# Patient Record
Sex: Female | Born: 1990 | Race: White | Hispanic: No | Marital: Single | Smoking: Current every day smoker
Health system: Southern US, Community
[De-identification: ages and names within clinical notes are randomized; demographics above are authoritative.]

## PROBLEM LIST (undated history)

## (undated) DIAGNOSIS — T7840XA Allergy, unspecified, initial encounter: Secondary | ICD-10-CM

## (undated) DIAGNOSIS — M21611 Bunion of right foot: Secondary | ICD-10-CM

## (undated) DIAGNOSIS — K219 Gastro-esophageal reflux disease without esophagitis: Secondary | ICD-10-CM

## (undated) DIAGNOSIS — M1990 Unspecified osteoarthritis, unspecified site: Secondary | ICD-10-CM

## (undated) DIAGNOSIS — D649 Anemia, unspecified: Secondary | ICD-10-CM

## (undated) HISTORY — PX: WISDOM TOOTH EXTRACTION: SHX21

## (undated) HISTORY — DX: Allergy, unspecified, initial encounter: T78.40XA

## (undated) HISTORY — DX: Anemia, unspecified: D64.9

## (undated) HISTORY — PX: FOOT SURGERY: SHX648

---

## 2009-08-29 ENCOUNTER — Encounter
Admission: RE | Admit: 2009-08-29 | Discharge: 2009-11-27 | Payer: Self-pay | Source: Home / Self Care | Admitting: *Deleted

## 2012-06-21 ENCOUNTER — Inpatient Hospital Stay (HOSPITAL_COMMUNITY): Payer: Medicaid Other

## 2012-06-21 ENCOUNTER — Inpatient Hospital Stay (HOSPITAL_COMMUNITY)
Admission: AD | Admit: 2012-06-21 | Discharge: 2012-06-21 | Disposition: A | Discharge status: Home / Self Care | Payer: Medicaid Other | Source: Ambulatory Visit | Attending: Obstetrics and Gynecology | Admitting: Obstetrics and Gynecology

## 2012-06-21 ENCOUNTER — Encounter (HOSPITAL_COMMUNITY): Payer: Self-pay | Admitting: Obstetrics and Gynecology

## 2012-06-21 DIAGNOSIS — N949 Unspecified condition associated with female genital organs and menstrual cycle: Secondary | ICD-10-CM

## 2012-06-21 DIAGNOSIS — N760 Acute vaginitis: Secondary | ICD-10-CM

## 2012-06-21 DIAGNOSIS — T8339XA Other mechanical complication of intrauterine contraceptive device, initial encounter: Secondary | ICD-10-CM | POA: Insufficient documentation

## 2012-06-21 DIAGNOSIS — B9689 Other specified bacterial agents as the cause of diseases classified elsewhere: Secondary | ICD-10-CM | POA: Insufficient documentation

## 2012-06-21 DIAGNOSIS — T8332XA Displacement of intrauterine contraceptive device, initial encounter: Secondary | ICD-10-CM

## 2012-06-21 DIAGNOSIS — A499 Bacterial infection, unspecified: Secondary | ICD-10-CM | POA: Insufficient documentation

## 2012-06-21 LAB — CBC
Hemoglobin: 13.5 g/dL (ref 12.0–15.0)
MCH: 33 pg (ref 26.0–34.0)
MCV: 96.1 fL (ref 78.0–100.0)
Platelets: 220 10*3/uL (ref 150–400)
RBC: 4.09 MIL/uL (ref 3.87–5.11)

## 2012-06-21 LAB — WET PREP, GENITAL: Trich, Wet Prep: NONE SEEN

## 2012-06-21 LAB — URINALYSIS, ROUTINE W REFLEX MICROSCOPIC
Bilirubin Urine: NEGATIVE
Ketones, ur: NEGATIVE mg/dL
Nitrite: NEGATIVE
pH: 6.5 (ref 5.0–8.0)

## 2012-06-21 MED ORDER — LIDOCAINE HCL (PF) 1 % IJ SOLN
INTRAMUSCULAR | Status: AC
  Administered 2012-06-21: 30 mL
  Filled 2012-06-21: qty 30

## 2012-06-21 MED ORDER — NORGESTIMATE-ETH ESTRADIOL 0.25-35 MG-MCG PO TABS: 1.0000 | ORAL_TABLET | Freq: Every day | ORAL | Status: DC

## 2012-06-21 MED ORDER — METRONIDAZOLE 500 MG PO TABS: 500.0000 mg | ORAL_TABLET | Freq: Two times a day (BID) | ORAL | Status: DC

## 2012-06-21 MED ORDER — OXYCODONE-ACETAMINOPHEN 5-325 MG PO TABS
2.0000 | ORAL_TABLET | Freq: Once | ORAL | Status: AC
  Administered 2012-06-21: 2 via ORAL
  Filled 2012-06-21: qty 2

## 2012-06-21 MED ORDER — IBUPROFEN 100 MG PO TABS: 800.0000 mg | ORAL_TABLET | Freq: Three times a day (TID) | ORAL | Status: DC | PRN

## 2012-06-21 MED ORDER — KETOROLAC TROMETHAMINE 60 MG/2ML IM SOLN
60.0000 mg | Freq: Once | INTRAMUSCULAR | Status: AC
  Administered 2012-06-21: 60 mg via INTRAMUSCULAR
  Filled 2012-06-21: qty 2

## 2012-06-21 NOTE — MAU Note (Signed)
Pt reports she has had merena in for 3 1/2 years. Starting to have abd pain and cramping and increased vaginal bleeding with a large Clot on Thursday night. Went to [facility] [place] yesterday and was told they did not know what was wrong with her so she deided to come here.

## 2012-06-21 NOTE — MAU Note (Signed)
Monique [name] is here in MAU with requests to have her IUD removed. She has had pelvic pain for months following a pelvic infection that was "caused by the IUD".

## 2012-06-21 NOTE — MAU Provider Note (Signed)
History     CSN: [id number]  Arrival date and time: 06/21/12 1144   None     Chief Complaint  Patient presents with  . Pelvic Pain   HPI 22 y.o. G1P0001 with pelvic pain since January of last year when she was treated for "an infected cervix", started having bleeding on Friday, pain is worsening. Went to [place] last night had bimanual exam and had antibiotic shot, did not have any test for infections or ultrasound.    History reviewed. No pertinent past medical history.  History reviewed. No pertinent past surgical history.  No family history on file.  History  Substance Use Topics  . Smoking status: Current Every Day Smoker  . Smokeless tobacco: Not on file  . Alcohol Use: Yes    Allergies: Allergies not on file  No prescriptions prior to admission    Review of Systems  Constitutional: Negative.   Respiratory: Negative.   Cardiovascular: Negative.   Gastrointestinal: Positive for abdominal pain. Negative for nausea, vomiting, diarrhea and constipation.  Genitourinary: Negative for dysuria, urgency, frequency, hematuria and flank pain.       Positive vaginal bleeding   Musculoskeletal: Negative.   Neurological: Negative.   Psychiatric/Behavioral: Negative.    Physical Exam   Blood pressure 113/69, pulse 80, temperature 97.7 F (36.5 C), temperature source Oral, resp. rate 18, height 5' 3.75" (1.619 m), weight 160 lb 9.6 oz (72.848 kg).  Physical Exam  Nursing note and vitals reviewed. Constitutional: She is oriented to person, place, and time. She appears well-developed and well-nourished. No distress.  Cardiovascular: Normal rate.   Respiratory: Effort normal.  GI: Soft. There is no tenderness.  Genitourinary: There is no tenderness or lesion on the right labia. There is no tenderness or lesion on the left labia. Uterus is tender. Cervix exhibits motion tenderness. Cervix exhibits no discharge and no friability. Right adnexum displays tenderness. Right  adnexum displays no mass and no fullness. Left adnexum displays tenderness. Left adnexum displays no mass and no fullness. There is bleeding around the vagina. No erythema around the vagina. Vaginal discharge found.  Attempted to remove IUD per patient request, grasped strings with ring forceps, unable to remove, pt reports severe pain with traction on strings  Musculoskeletal: Normal range of motion.  Neurological: She is alert and oriented to person, place, and time.  Skin: Skin is warm and dry.  Psychiatric: She has a normal mood and affect.    MAU Course  Procedures Results for orders placed during the hospital encounter of 06/21/12 (from the past 24 hour(s))  URINALYSIS, ROUTINE W REFLEX MICROSCOPIC     Status: None   Collection Time    06/21/12 12:09 PM      Result Value Range   Color, Urine YELLOW  YELLOW   APPearance CLEAR  CLEAR   Specific Gravity, Urine 1.010  1.005 - 1.030   pH 6.5  5.0 - 8.0   Glucose, UA NEGATIVE  NEGATIVE mg/dL   Hgb urine dipstick NEGATIVE  NEGATIVE   Bilirubin Urine NEGATIVE  NEGATIVE   Ketones, ur NEGATIVE  NEGATIVE mg/dL   Protein, ur NEGATIVE  NEGATIVE mg/dL   Urobilinogen, UA 0.2  0.0 - 1.0 mg/dL   Nitrite NEGATIVE  NEGATIVE   Leukocytes, UA NEGATIVE  NEGATIVE  POCT PREGNANCY, URINE     Status: None   Collection Time    06/21/12 12:15 PM      Result Value Range   Preg Test, Ur NEGATIVE  NEGATIVE  WET PREP, GENITAL     Status: Abnormal   Collection Time    06/21/12  1:00 PM      Result Value Range   Yeast Wet Prep HPF POC NONE SEEN  NONE SEEN   Trich, Wet Prep NONE SEEN  NONE SEEN   Clue Cells Wet Prep HPF POC MANY (*) NONE SEEN   WBC, Wet Prep HPF POC FEW (*) NONE SEEN  CBC     Status: None   Collection Time    06/21/12  1:20 PM      Result Value Range   WBC 8.7  4.0 - 10.5 K/uL   RBC 4.09  3.87 - 5.11 MIL/uL   Hemoglobin 13.5  12.0 - 15.0 g/dL   HCT 44.0  34.7 - 42.5 %   MCV 96.1  78.0 - 100.0 fL   MCH 33.0  26.0 - 34.0 pg   MCHC  34.4  30.0 - 36.0 g/dL   RDW 95.6  38.7 - 56.4 %   Platelets 220  150 - 400 K/uL   US Transvaginal Non-ob  06/21/2012  *RADIOLOGY REPORT*  Clinical Data: Pelvic pain.  IUD.  TRANSABDOMINAL AND TRANSVAGINAL ULTRASOUND OF PELVIS Technique:  Both transabdominal and transvaginal ultrasound examinations of the pelvis were performed. Transabdominal technique was performed for global imaging of the pelvis including uterus, ovaries, adnexal regions, and pelvic cul-de-sac.  It was necessary to proceed with endovaginal exam following the transabdominal exam to visualize the the endometrium.  Comparison:  04/10/2011  Findings:  Uterus: 3.3 x 4.2 x 7.3 cm, unremarkable.  Endometrium: IUD projects in expected location.  Endometrium 4.1 mm in thickness.  Right ovary:  Unremarkable, 17 x 19 x 32 mm.  No adnexal mass.  Left ovary: 18 x 26 x 30 mm, unremarkable.  No adnexal mass.  Other findings: No free fluid  IMPRESSION:  1.  IUD projects in expected location. 2.  No mass or other abnormalities identified.   Original Report Authenticated By: D. Andria Rhein, MD    US Pelvis Complete  06/21/2012  *RADIOLOGY REPORT*  Clinical Data: Pelvic pain.  IUD.  TRANSABDOMINAL AND TRANSVAGINAL ULTRASOUND OF PELVIS Technique:  Both transabdominal and transvaginal ultrasound examinations of the pelvis were performed. Transabdominal technique was performed for global imaging of the pelvis including uterus, ovaries, adnexal regions, and pelvic cul-de-sac.  It was necessary to proceed with endovaginal exam following the transabdominal exam to visualize the the endometrium.  Comparison:  04/10/2011  Findings:  Uterus: 3.3 x 4.2 x 7.3 cm, unremarkable.  Endometrium: IUD projects in expected location.  Endometrium 4.1 mm in thickness.  Right ovary:  Unremarkable, 17 x 19 x 32 mm.  No adnexal mass.  Left ovary: 18 x 26 x 30 mm, unremarkable.  No adnexal mass.  Other findings: No free fluid  IMPRESSION:  1.  IUD projects in expected location. 2.   No mass or other abnormalities identified.   Original Report Authenticated By: D. Andria Rhein, MD     Dr. Emelda Fear to MAU - with pt written consent, paracervical block placed and IUD removed without difficulty   Assessment and Plan   1. Malpositioned IUD, initial encounter   2. BV (bacterial vaginosis)   Pt requested OCPs for ongoing contraception - gave rx x 2 months, needs to f/u with her own doctor. Use back up method for 7 days.     Medication List    TAKE these medications       ibuprofen 100 MG  tablet  Commonly known as:  ADVIL,MOTRIN  Take 8 tablets (800 mg total) by mouth every 8 (eight) hours as needed for pain or fever.     metroNIDAZOLE 500 MG tablet  Commonly known as:  FLAGYL  Take 1 tablet (500 mg total) by mouth 2 (two) times daily.     norgestimate-ethinyl estradiol 0.25-35 MG-MCG tablet  Commonly known as:  ORTHO-CYCLEN,SPRINTEC,PREVIFEM  Take 1 tablet by mouth daily.            Follow-up Information   Schedule an appointment as soon as possible for a visit with your doctor.       Hether Anselmo 06/21/2012, 12:25 PM

## 2012-06-21 NOTE — MAU Provider Note (Signed)
Patient interviewed, REVIEWED IDENTIFYING THE IUD AS BEING IN THE LOWER UTERINE SEGMENT WITH THE ARMS OF THE IUD SUGGESTING INTRAMUSCULAR POSITION. Consent obtained and then IUD removed under paracervical block after timeout conducted and consents obtained  Remainder of evaluation as per documentation by Georges Mouse CNM

## 2014-01-31 ENCOUNTER — Encounter (HOSPITAL_COMMUNITY): Payer: Self-pay | Admitting: Obstetrics and Gynecology

## 2014-06-27 ENCOUNTER — Encounter (HOSPITAL_COMMUNITY): Payer: Self-pay | Admitting: Emergency Medicine

## 2014-06-27 ENCOUNTER — Emergency Department (HOSPITAL_COMMUNITY)
Admission: EM | Admit: 2014-06-27 | Discharge: 2014-06-27 | Disposition: A | Discharge status: Home / Self Care | Payer: Medicaid Other | Attending: Emergency Medicine | Admitting: Emergency Medicine

## 2014-06-27 ENCOUNTER — Emergency Department (HOSPITAL_COMMUNITY): Payer: Medicaid Other

## 2014-06-27 DIAGNOSIS — Y998 Other external cause status: Secondary | ICD-10-CM | POA: Insufficient documentation

## 2014-06-27 DIAGNOSIS — Z720 Tobacco use: Secondary | ICD-10-CM | POA: Insufficient documentation

## 2014-06-27 DIAGNOSIS — Z79899 Other long term (current) drug therapy: Secondary | ICD-10-CM | POA: Insufficient documentation

## 2014-06-27 DIAGNOSIS — X58XXXA Exposure to other specified factors, initial encounter: Secondary | ICD-10-CM | POA: Insufficient documentation

## 2014-06-27 DIAGNOSIS — Z792 Long term (current) use of antibiotics: Secondary | ICD-10-CM | POA: Insufficient documentation

## 2014-06-27 DIAGNOSIS — M795 Residual foreign body in soft tissue: Secondary | ICD-10-CM

## 2014-06-27 DIAGNOSIS — Z791 Long term (current) use of non-steroidal anti-inflammatories (NSAID): Secondary | ICD-10-CM | POA: Insufficient documentation

## 2014-06-27 DIAGNOSIS — Y9289 Other specified places as the place of occurrence of the external cause: Secondary | ICD-10-CM | POA: Insufficient documentation

## 2014-06-27 DIAGNOSIS — B070 Plantar wart: Secondary | ICD-10-CM | POA: Insufficient documentation

## 2014-06-27 DIAGNOSIS — Y9389 Activity, other specified: Secondary | ICD-10-CM | POA: Insufficient documentation

## 2014-06-27 DIAGNOSIS — S90851A Superficial foreign body, right foot, initial encounter: Secondary | ICD-10-CM | POA: Insufficient documentation

## 2014-06-27 MED ORDER — CEPHALEXIN 500 MG PO CAPS: 500.0000 mg | ORAL_CAPSULE | Freq: Four times a day (QID) | ORAL | Status: DC

## 2014-06-27 MED ORDER — IBUPROFEN 800 MG PO TABS: 800.0000 mg | ORAL_TABLET | Freq: Three times a day (TID) | ORAL | Status: DC

## 2014-06-27 NOTE — Discharge Instructions (Signed)
Plantar Warts  Plantar warts are growths on the bottom of your foot. Warts are caused by a germ.   HOME CARE  · Soak your foot in warm water. Dry your foot when you are done. Remove the top layer of softened skin, then apply any medicine as told by your doctor.  · Remove any bandages daily. File off extra wart tissue. Repeat this as told by your doctor until the wart goes away.  · Only use medicine as told by your doctor.  · Use a bandage with a hole in it (doughnut bandage) to relieve pain. Put the hole over the wart.  · Wear shoes and socks and change them daily.  · Keep your foot clean and dry.  · Check your feet regularly.  · Avoid contact with warts on other people.  · Have your warts checked by your doctor.  GET HELP RIGHT AWAY IF:  The treated skin becomes red, puffy (swollen), or painful.  MAKE SURE YOU:  · Understand these instructions.  · Will watch your condition.  · Will get help right away if you are not doing well or get worse.  Document Released: 04/20/2010 Document Revised: 08/02/2013 Document Reviewed: 04/20/2010  Exit[employer]® Patient Information ©2015 [employer]. This information is not intended to replace advice given to you by your health care provider. Make sure you discuss any questions you have with your health care provider.

## 2014-06-27 NOTE — ED Notes (Signed)
For last 6-8 months there has been spot on bottom of right right foot.  In last 2 weeks, notice swelling and area opened up last night.  Rates pain 10/10.  Took tylenol with no relief.

## 2014-06-27 NOTE — ED Provider Notes (Signed)
CSN: [id number]     Arrival date & time 06/27/14  1443 History  This chart was scribed for non-physician practitioner, Kem Parkinson, PA-C, working with Orlie Dakin, MD, by Chester Holstein, ED Scribe. This patient was seen in room APFT20/APFT20 and the patient's care was started at [facility] PM.    Chief Complaint  Patient presents with  . Foot Pain    Patient is a 24 y.o. female presenting with lower extremity pain. The history is provided by the patient. No language interpreter was used.  Foot Pain   HPI Comments: Monique [name] is a 24 y.o. female who presents to the Emergency Department complaining of wound to plantar surface of right foot for 6-8 months, and worsening in last 2 weeks. Pt notes associated pain, redness, warmth, and swelling, stating it is difficult to wear shoes. Pt states she is unable to ambulate today due to pain.  Pt has tried to clean area with peroxide and states solution reacted to area with bubbling. She notes she often goes barefooted. Pt has been taking Tylenol with no relief. Pt has tried Epsom soak and states increased pain with use. Pt denies any known injury, fever, and chills ,possible foreign body or symptoms proximal to the foot.    History reviewed. No pertinent past medical history. History reviewed. No pertinent past surgical history. History reviewed. No pertinent family history. History  Substance Use Topics  . Smoking status: Current Every Day Smoker  . Smokeless tobacco: Not on file  . Alcohol Use: Yes   OB History    Gravida Para Term Preterm AB TAB SAB Ectopic Multiple Living   1 0 0 0 0 0 0 0 0 1      Review of Systems  Constitutional: Negative for fever, chills and activity change.  Musculoskeletal: Positive for myalgias (right foot pain) and joint swelling.  Skin: Positive for wound.  Neurological: Negative for weakness, light-headedness and numbness.  Hematological: Does not bruise/bleed easily.  All other systems reviewed and are  negative.     Allergies  Review of patient's allergies indicates no known allergies.  Home Medications   Prior to Admission medications   Medication Sig Start Date End Date Taking? Authorizing Provider  ibuprofen (ADVIL,MOTRIN) 100 MG tablet Take 8 tablets (800 mg total) by mouth every 8 (eight) hours as needed for pain or fever. 06/21/12   Rosezella Rumpf, CNM  metroNIDAZOLE (FLAGYL) 500 MG tablet Take 1 tablet (500 mg total) by mouth 2 (two) times daily. 06/21/12   Rosezella Rumpf, CNM  norgestimate-ethinyl estradiol (ORTHO-CYCLEN,SPRINTEC,PREVIFEM) 0.25-35 MG-MCG tablet Take 1 tablet by mouth daily. 06/21/12   Rosezella Rumpf, CNM   BP 127/83 mmHg  Pulse 82  Temp(Src) 98.2 F (36.8 C) (Oral)  Resp 18  Ht 5\' 4"  (1.626 m)  Wt 169 lb (76.658 kg)  BMI 28.99 kg/m2  SpO2 100%  LMP 05/25/2014 Physical Exam  Constitutional: She is oriented to person, place, and time. She appears well-developed and well-nourished. No distress.  HENT:  Head: Normocephalic and atraumatic.  Eyes: Conjunctivae are normal.  Neck: Normal range of motion. Neck supple.  Cardiovascular: Normal rate, regular rhythm, normal heart sounds and intact distal pulses.   No murmur heard. Pulmonary/Chest: Effort normal and breath sounds normal. No respiratory distress.  Musculoskeletal: Normal range of motion. She exhibits tenderness. She exhibits no edema.  Large plantar wart lateral right foot; no erythema or edema.  No open lesions or obvious FB's.  DP pulses and distal intact intact.  Neurological: She is alert and oriented to person, place, and time. Coordination normal.  Skin: Skin is warm and dry. No erythema.  Psychiatric: She has a normal mood and affect. Her behavior is normal.  Nursing note and vitals reviewed.   ED Course  Procedures (including critical care time) DIAGNOSTIC STUDIES: Oxygen Saturation is 100% on room air, normal by my interpretation.    COORDINATION OF CARE: 3:32 PM Discussed  treatment plan with patient at beside, the patient agrees with the plan and has no further questions at this time.   Labs Review Labs Reviewed - No data to display  Imaging Review Dg Foot Complete Right  06/27/2014   CLINICAL DATA:  Soft tissue wound  EXAM: RIGHT FOOT COMPLETE - 3+ VIEW  COMPARISON:  None.  FINDINGS: There is no evidence of fracture or dislocation. There is no evidence of arthropathy or other focal bone abnormality. No radiopaque foreign body is identified.  IMPRESSION: No acute abnormality noted.   Electronically Signed   By: Inez Catalina M.D.   On: 06/27/2014 16:02     EKG Interpretation None      MDM   Final diagnoses:  Foreign body (FB) in soft tissue  Plantar wart of right foot   Pt is well appearing.  Ambulates with steady gait.  She has a large, chronic appearing plantar wart without signs of acute infection, although she notes swelling of the foot and redness several days ago. No radiographic evidence of soft tissue FB.  (FB not a diagnosis, used as indication for imaging and pulled into chart automatically)  Given referral info for podiatry and pt agrees to plan.     I personally performed the services described in this documentation, which was scribed in my presence. The recorded information has been reviewed and is accurate.'    Patrice Paradise, PA-C 06/28/14 2124  Orlie Dakin, MD 06/29/14 1423

## 2014-06-27 NOTE — ED Notes (Signed)
Rt foot pain, plantar surface.  Foot is very dirty.

## 2016-01-09 ENCOUNTER — Ambulatory Visit (INDEPENDENT_AMBULATORY_CARE_PROVIDER_SITE_OTHER): Payer: 59

## 2016-01-09 ENCOUNTER — Encounter: Payer: Self-pay | Admitting: Podiatry

## 2016-01-09 ENCOUNTER — Ambulatory Visit (INDEPENDENT_AMBULATORY_CARE_PROVIDER_SITE_OTHER): Payer: 59 | Admitting: Podiatry

## 2016-01-09 VITALS — BP 126/80 | HR 55 | Resp 14 | Ht 64.0 in | Wt 140.0 lb

## 2016-01-09 DIAGNOSIS — M778 Other enthesopathies, not elsewhere classified: Secondary | ICD-10-CM

## 2016-01-09 DIAGNOSIS — M7751 Other enthesopathy of right foot: Secondary | ICD-10-CM | POA: Diagnosis not present

## 2016-01-09 DIAGNOSIS — M2010 Hallux valgus (acquired), unspecified foot: Secondary | ICD-10-CM

## 2016-01-09 DIAGNOSIS — M779 Enthesopathy, unspecified: Secondary | ICD-10-CM

## 2016-01-09 DIAGNOSIS — R52 Pain, unspecified: Secondary | ICD-10-CM

## 2016-01-09 NOTE — Progress Notes (Signed)
   Subjective:    Patient ID: Monique [name], female    DOB: 1990-08-04, 25 y.o.   MRN: [id number]  HPI this patient presents to the office with chief complaint of a painful callus for over a year. She says that the callus is especially painful and sore due to her job which requires significant amount of standing and walking. She says this callus is painful walking and wearing her shoes. She says she was seen by a podiatrist 3-4 years ago and diagnosed with tendinitis. according to the patient. She also says the callus was surgically removed but has had quickly returned.  She says that when she walks. She turns her foot outward to avoid pain in her big toe joint. She has no history of trauma or injury to the foot. No other self treatment was provided. She presents the office today for an evaluation and treatment of this condition    Review of Systems  All other systems reviewed and are negative.      Objective:   Physical Exam GENERAL APPEARANCE: Alert, conversant. Appropriately groomed. No acute distress.  VASCULAR: Pedal pulses are  palpable at  [facility] and PT bilateral.  Capillary refill time is immediate to all digits,  Normal temperature gradient.  Digital hair growth is present bilateral  NEUROLOGIC: sensation is normal to 5.07 monofilament at 5/5 sites bilateral.  Light touch is intact bilateral, Muscle strength normal.  MUSCULOSKELETAL: acceptable muscle strength, tone and stability bilateral.  Intrinsic muscluature intact bilateral. 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  Pain on palpation dorsal aspect first metatarsal.  Palpable pain sub 4th metatarsal right foot.  DERMATOLOGIC: skin color, texture, and turgor are within normal limits.  No preulcerative lesions or ulcers  are seen, no interdigital maceration noted.  No open lesions present.  Digital nails are asymptomatic. No drainage noted. Severe porokeratotic lesion sub 4th metatarsal right foot.         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   IE  Xray reveals severe HAV.  I believe that due to her job position. She needs to perform significant amount of walking. This causes the pro-keratoses on her right forefoot to be painful. This porokeratosis is caused due to the severity of the bunion, right foot. I offered to provide injection therapy for her capsulitis which she was not interested in receiving. I then told her she would benefit from a surgical consult with one of the surgeons in the office. She is to make an appointment with Dr. Paulla Dolly as she leaves today   Gardiner Barefoot DPM

## 2016-01-11 ENCOUNTER — Telehealth: Payer: Self-pay | Admitting: *Deleted

## 2016-01-11 NOTE — Telephone Encounter (Signed)
OK to do ibuprofen. I am not going to prescribe a pain medication prior to surgery on a patient I have not even seen yet.

## 2016-01-11 NOTE — Telephone Encounter (Addendum)
Pt states at appt 01/09/2016 Dr. Prudence Davidson referred to Dr. Jacqualyn Posey for surgery 01/19/2016. Pt states her foot hurts and she wanted to know if Dr. Jacqualyn Posey would prescribe Ibuprofen until the surgery. 01/12/2016-Unable to leave message pt's voice mailbox is not set up. 01/19/2016-Pt states Dr. Jacqualyn Posey forgot to call pain medication to the [facility]. Informed pt the rx would be faxed to [facility]. Faxed tramadol rx.

## 2016-01-19 ENCOUNTER — Ambulatory Visit (INDEPENDENT_AMBULATORY_CARE_PROVIDER_SITE_OTHER): Payer: 59 | Admitting: Podiatry

## 2016-01-19 ENCOUNTER — Encounter: Payer: Self-pay | Admitting: Podiatry

## 2016-01-19 DIAGNOSIS — M778 Other enthesopathies, not elsewhere classified: Secondary | ICD-10-CM

## 2016-01-19 DIAGNOSIS — M779 Enthesopathy, unspecified: Secondary | ICD-10-CM

## 2016-01-19 DIAGNOSIS — M2010 Hallux valgus (acquired), unspecified foot: Secondary | ICD-10-CM | POA: Diagnosis not present

## 2016-01-19 DIAGNOSIS — Q828 Other specified congenital malformations of skin: Secondary | ICD-10-CM

## 2016-01-19 DIAGNOSIS — M7751 Other enthesopathy of right foot: Secondary | ICD-10-CM

## 2016-01-19 MED ORDER — TRAMADOL HCL 50 MG PO TABS: 50.0000 mg | ORAL_TABLET | Freq: Two times a day (BID) | ORAL | 0 refills | Status: DC | PRN

## 2016-02-12 NOTE — Progress Notes (Signed)
Subjective: 25 year old female presents the office today for surgical consultation, evaluation of painful callus on the right foot as well as for bilateral bunion deformities. She states that she had a callus previously surgically removed but it quickly we turned. She does a lot of walking and standing all day. She states that she has difficulty wearing certain shoes. She was seen by another physician about 4 years ago and she was treated for tendinitis. She said no recent treatment for this otherwise. She was last seen by Dr. Prudence Davidson and she declined a steroid injection. Denies any systemic complaints such as fevers, chills, nausea, vomiting. No acute changes since last appointment, and no other complaints at this time.   Objective: AAO x3, NAD DP/PT pulses palpable bilaterally, CRT less than 3 seconds Mild HAV is present on the right foot. There is minimal tenderness palpation of the bunion. The majority of her tenderness appears below the lesser callus submetatarsal 4. She says been ongoing for quite some time. Upon debridement there is no ongoing ulceration, drainage or any signs of infection. There is no pinpoint bleeding or evidence of verruca there is no foreign body . There is localized edema to the area any erythema or increase in warmth. There is no drainage or pus expressed.  No open lesions or pre-ulcerative lesions.  No pain with calf compression, swelling, warmth, erythema  Assessment: Hyperkerotic lesion to the left 4th submet  Plan: -All treatment options discussed with the patient including all alternatives, risks, complications.  -previous x-rays were reviewed. I discussed with conservative and surgical treatment options. At ths point majority of her symptoms are localized to the callus d she is having minimal tenderness in the bunion. I do not believe that while fixing the bunion is going to fix her problems submetatarsal 4. And she wishes to continue with conservative treatment for  now and she does 2. Hyperkeratotic lesion was debrided without, occasions or bleeding. Area was cleaned and salicylic acid applied followed by a bandage. Post procedure tractions were discussed. I'll see her back in 4 weeks to see how she is doing or sooner if any issues are to arise. -Patient encouraged to call the office with any questions, concerns, change in symptoms.   Celesta Gentile, DPM

## 2016-02-16 ENCOUNTER — Encounter: Payer: Self-pay | Admitting: Podiatry

## 2016-02-16 ENCOUNTER — Ambulatory Visit (INDEPENDENT_AMBULATORY_CARE_PROVIDER_SITE_OTHER): Payer: 59 | Admitting: Podiatry

## 2016-02-16 DIAGNOSIS — Q828 Other specified congenital malformations of skin: Secondary | ICD-10-CM

## 2016-02-16 MED ORDER — HYDROCODONE-ACETAMINOPHEN 5-325 MG PO TABS: 1.0000 | ORAL_TABLET | Freq: Four times a day (QID) | ORAL | 0 refills | Status: DC | PRN

## 2016-02-26 NOTE — Progress Notes (Signed)
Subjective: 25 year old female presents the office they for follow-up evaluation of callus to the right foot to metatarsal 4. She states that the area after the acid treatment the area blisters some and a bigger but it looks significant peel off. Area is tender with pressure in shoe gear. She denies any swelling or redness or any drainage. Denies any systemic complaints such as fevers, chills, nausea, vomiting. No acute changes since last appointment, and no other complaints at this time.   Objective: AAO x3, NAD DP/PT pulses palpable bilaterally, CRT less than 3 seconds Right first metatarsal 4 hyperkeratotic lesion. There is thick hyperkeratotic tissue which is peeling off likely from the acid treatment. Upon debridement there does appear to be a core to this area which is starting to come out more. There is tenderness sharply along this lesion. There is no identifiable  No  foreign body in there is no evidence of verruca today.edema, erythema, increase in warmth to bilateral lower extremities.  No open lesions or pre-ulcerative lesions.  No pain with calf compression, swelling, warmth, erythema  Assessment: Porokeratosis right foot   Plan: -All treatment options discussed with the patient including all alternatives, risks, complications.  -Lesion sharply debrided without complications or bleeding. The areas clean and pelvis place followed by salicylic acid and a bandage. It appears that the deep portion of the calluses starting to come out more. Monitor for infection. Post-procedure instructions were discussed.  -Patient encouraged to call the office with any questions, concerns, change in symptoms.   Celesta Gentile, DPM

## 2016-03-08 ENCOUNTER — Ambulatory Visit: Payer: 59 | Admitting: Podiatry

## 2016-03-14 ENCOUNTER — Ambulatory Visit: Payer: 59 | Admitting: Podiatry

## 2016-05-05 ENCOUNTER — Emergency Department (HOSPITAL_COMMUNITY)
Admission: EM | Admit: 2016-05-05 | Discharge: 2016-05-06 | Disposition: A | Discharge status: Home / Self Care | Payer: Medicaid Other | Attending: Emergency Medicine | Admitting: Emergency Medicine

## 2016-05-05 ENCOUNTER — Encounter (HOSPITAL_COMMUNITY): Payer: Self-pay | Admitting: *Deleted

## 2016-05-05 DIAGNOSIS — M79671 Pain in right foot: Secondary | ICD-10-CM

## 2016-05-05 DIAGNOSIS — L84 Corns and callosities: Secondary | ICD-10-CM | POA: Insufficient documentation

## 2016-05-05 DIAGNOSIS — F17200 Nicotine dependence, unspecified, uncomplicated: Secondary | ICD-10-CM | POA: Insufficient documentation

## 2016-05-05 MED ORDER — HYDROCODONE-ACETAMINOPHEN 5-325 MG PO TABS
1.0000 | ORAL_TABLET | Freq: Once | ORAL | Status: AC
  Administered 2016-05-05: 1 via ORAL
  Filled 2016-05-05: qty 1

## 2016-05-05 MED ORDER — PROMETHAZINE HCL 12.5 MG PO TABS
12.5000 mg | ORAL_TABLET | Freq: Once | ORAL | Status: AC
  Administered 2016-05-05: 12.5 mg via ORAL
  Filled 2016-05-05: qty 1

## 2016-05-05 MED ORDER — DOXYCYCLINE HYCLATE 100 MG PO TABS
100.0000 mg | ORAL_TABLET | Freq: Once | ORAL | Status: AC
  Administered 2016-05-05: 100 mg via ORAL
  Filled 2016-05-05: qty 1

## 2016-05-05 MED ORDER — IBUPROFEN 800 MG PO TABS
800.0000 mg | ORAL_TABLET | Freq: Once | ORAL | Status: AC
  Administered 2016-05-05: 800 mg via ORAL
  Filled 2016-05-05: qty 1

## 2016-05-05 NOTE — ED Triage Notes (Signed)
Pt c/o right foot pain and swelling that became worse since Friday, denies any injury,

## 2016-05-05 NOTE — ED Provider Notes (Signed)
[facility] DEPT Provider Note   CSN: [id number] Arrival date & time: 05/05/16  2242     History   Chief Complaint Chief Complaint  Patient presents with  . Foot Pain    HPI Monique [name] is a 26 y.o. female.  HPI  History reviewed. No pertinent past medical history.  There are no active problems to display for this patient.   History reviewed. No pertinent surgical history.  OB History    Gravida Para Term Preterm AB Living   1 0 0 0 0 1   SAB TAB Ectopic Multiple Live Births   0 0 0 0         Home Medications    Prior to Admission medications   Medication Sig Start Date End Date Taking? Authorizing Provider  HYDROcodone-acetaminophen (NORCO/VICODIN) 5-325 MG tablet Take 1 tablet by mouth every 6 (six) hours as needed for moderate pain. 02/16/16   Trula Slade, DPM  ibuprofen (ADVIL,MOTRIN) 800 MG tablet Take 1 tablet (800 mg total) by mouth 3 (three) times daily. Patient not taking: Reported on 01/09/2016 06/27/14   Tammy Triplett, PA-C  norgestimate-ethinyl estradiol (ORTHO-CYCLEN,SPRINTEC,PREVIFEM) 0.25-35 MG-MCG tablet Take 1 tablet by mouth daily. Patient not taking: Reported on 01/09/2016 06/21/12   Rosezella Rumpf, CNM  traMADol (ULTRAM) 50 MG tablet Take 1 tablet (50 mg total) by mouth every 12 (twelve) hours as needed. 01/19/16   Trula Slade, DPM    Family History No family history on file.  Social History Social History  Substance Use Topics  . Smoking status: Current Every Day Smoker  . Smokeless tobacco: Never Used  . Alcohol use Yes     Allergies   Patient has no known allergies.   Review of Systems Review of Systems  Constitutional: Negative for activity change.       All ROS Neg except as noted in HPI  HENT: Negative for nosebleeds.   Eyes: Negative for photophobia and discharge.  Respiratory: Negative for cough, shortness of breath and wheezing.   Cardiovascular: Negative for chest pain and palpitations.    Gastrointestinal: Negative for abdominal pain and blood in stool.  Genitourinary: Negative for dysuria, frequency and hematuria.  Musculoskeletal: Negative for arthralgias, back pain and neck pain.       Foot pain  Skin: Negative.   Neurological: Negative for dizziness, seizures and speech difficulty.  Psychiatric/Behavioral: Negative for confusion and hallucinations.     Physical Exam Updated Vital Signs BP 113/70 (BP Location: Left Arm)   Pulse 95   Temp 97.8 F (36.6 C) (Oral)   Resp 16   Ht 5\' 4"  (1.626 m)   Wt 72.6 kg   LMP 04/15/2016   SpO2 98%   BMI 27.46 kg/m   Physical Exam  Constitutional: She is oriented to person, place, and time. She appears well-developed and well-nourished.  Non-toxic appearance.  HENT:  Head: Normocephalic.  Right Ear: Tympanic membrane and external ear normal.  Left Ear: Tympanic membrane and external ear normal.  Eyes: EOM and lids are normal. Pupils are equal, round, and reactive to light.  Neck: Normal range of motion. Neck supple. Carotid bruit is not present.  Cardiovascular: Normal rate, regular rhythm, normal heart sounds, intact distal pulses and normal pulses.   Pulmonary/Chest: Breath sounds normal. No respiratory distress.  Abdominal: Soft. Bowel sounds are normal. There is no tenderness. There is no guarding.  Musculoskeletal: Normal range of motion.       Right foot: There is tenderness and  swelling. There is normal capillary refill and no deformity.       Feet:  Large tender callus below the 2nd MP joint on the plantar surface. No red streaks. No drainage present. The right foot is not hot. Pain to palpation from the mid foot to the MP joint areas.  Lymphadenopathy:       Head (right side): No submandibular adenopathy present.       Head (left side): No submandibular adenopathy present.    She has no cervical adenopathy.  Neurological: She is alert and oriented to person, place, and time. She has normal strength. No cranial  nerve deficit or sensory deficit.  Skin: Skin is warm and dry.  Psychiatric: She has a normal mood and affect. Her speech is normal.  Nursing note and vitals reviewed.    ED Treatments / Results  Labs (all labs ordered are listed, but only abnormal results are displayed) Labs Reviewed - No data to display  EKG  EKG Interpretation None       Radiology No results found.  Procedures Procedures (including critical care time)  Medications Ordered in ED Medications - No data to display   Initial Impression / Assessment and Plan / ED Course  I have reviewed the triage vital signs and the nursing notes.  Pertinent labs & imaging results that were available during my care of the patient were reviewed by me and considered in my medical decision making (see chart for details).     **I have reviewed nursing notes, vital signs, and all appropriate lab and imaging results for this patient.*  Final Clinical Impressions(s) / ED Diagnoses  Pt has been treated for callus formation and other foot problems in the past. She was to have surgery on the right foot 2-3 months ago but has not had the surgery yet.  Tonight she has pain and she feels she has some swelling of the right foot. No red streaking. No temp elevation. No recent trauma. Pt treated with doxycycline, warm soaks, ibuprofen and norco. I have strongly encouraged pt to see a podiatrist as soon as possible.   Final diagnoses:  Foot pain, right  Callus of foot    New Prescriptions New Prescriptions   No medications on file     Lily Kocher, PA-C 05/06/16 0011    Forde Dandy, MD 05/06/16 [phone]

## 2016-05-06 MED ORDER — DOXYCYCLINE HYCLATE 100 MG PO CAPS: 100.0000 mg | ORAL_CAPSULE | Freq: Two times a day (BID) | ORAL | 0 refills | Status: DC

## 2016-05-06 MED ORDER — HYDROCODONE-ACETAMINOPHEN 5-325 MG PO TABS: 1.0000 | ORAL_TABLET | ORAL | 0 refills | Status: DC | PRN

## 2016-05-06 MED ORDER — IBUPROFEN 600 MG PO TABS: 600.0000 mg | ORAL_TABLET | Freq: Four times a day (QID) | ORAL | 0 refills | Status: DC

## 2016-05-06 NOTE — Discharge Instructions (Signed)
YOur exam suggest a recurrence of the callus formation of the right foot. Please use doxycycline for possible infection and ibuprofen for swelling and inflammation. Use norco for more severe pain. This medication may cause drowsiness. Please do not drink, drive, or participate in activity that requires concentration while taking this medication. Please see Dr Paulla Dolly or a member of his team as soon as possible for podiatry evaluation of the foot. Warm epsom salt tub soaks daily may be helpful.

## 2016-05-09 ENCOUNTER — Encounter: Payer: Self-pay | Admitting: Podiatry

## 2016-05-09 ENCOUNTER — Ambulatory Visit (INDEPENDENT_AMBULATORY_CARE_PROVIDER_SITE_OTHER): Payer: Managed Care, Other (non HMO)

## 2016-05-09 ENCOUNTER — Ambulatory Visit (INDEPENDENT_AMBULATORY_CARE_PROVIDER_SITE_OTHER): Payer: Managed Care, Other (non HMO) | Admitting: Podiatry

## 2016-05-09 VITALS — BP 108/63 | HR 89 | Temp 97.7°F | Resp 18

## 2016-05-09 DIAGNOSIS — L03119 Cellulitis of unspecified part of limb: Secondary | ICD-10-CM

## 2016-05-09 DIAGNOSIS — L02619 Cutaneous abscess of unspecified foot: Secondary | ICD-10-CM

## 2016-05-09 DIAGNOSIS — R52 Pain, unspecified: Secondary | ICD-10-CM | POA: Diagnosis not present

## 2016-05-09 DIAGNOSIS — M79671 Pain in right foot: Secondary | ICD-10-CM

## 2016-05-09 NOTE — Patient Instructions (Signed)

## 2016-05-10 ENCOUNTER — Encounter: Payer: Self-pay | Admitting: Podiatry

## 2016-05-10 DIAGNOSIS — L03119 Cellulitis of unspecified part of limb: Principal | ICD-10-CM

## 2016-05-10 DIAGNOSIS — L02619 Cutaneous abscess of unspecified foot: Secondary | ICD-10-CM | POA: Insufficient documentation

## 2016-05-10 NOTE — Progress Notes (Signed)
Subjective: 26 year old female presents the also concerns her right foot pain and swelling. She went emergency room over the weekend and she was given doxycycline for infection. She presents today for follow-up. She states over the last couple weeks her right foot is becoming very painful. I have not seen her since November of last year where she was being treated for callus in this area. At the time we discussed surgical intervention but she wished to hold off given work. Denies any systemic complaints such as fevers, chills, nausea, vomiting. No acute changes since last appointment, and no other complaints at this time.   Objective: AAO x3, NAD DP/PT pulses palpable bilaterally, CRT less than 3 seconds There is mild edema to the right foot compared to the left foot. There is mild increase in warmth. There is no significant erythema or ascending cellulitis. On the plantar aspect of the right foot third metatarsal force hyperkeratotic lesion. Upon debridement there was tenderness to palpation however purulence was expressed that is cultured today. No open lesions or pre-ulcerative lesions.  No pain with calf compression, swelling, warmth, erythema  Assessment: 26 year old female abscess right foot  Plan: -All treatment options discussed with the patient including all alternatives, risks, complications.  -Keratotic lesion was debrided today and the pus was cultured. Continue antibiotics. Given the pain level as well as the infection I recommended incision and drainage. She does not want to proceed with this in the office and therefore we will schedule her tomorrow in the operating room to I&D, wash out the infection. -The incision placement as well as the postoperative course was discussed with the patient. I discussed risks of the surgery which include, but not limited to, infection, bleeding, pain, swelling, need for further surgery, delayed or nonhealing, painful or ugly scar, numbness or sensation  changes, over/under correction, recurrence, transfer lesions, further deformity, hardware failure, DVT/PE, loss of toe/foot. Patient understands these risks and wishes to proceed with surgery. The surgical consent was reviewed with the patient all 3 pages were signed. No promises or guarantees were given to the outcome of the procedure. All questions were answered to the best of my ability. Before the surgery the patient was encouraged to call the office if there is any further questions. The surgery will be performed at the [facility] on an outpatient basis.  Celesta Gentile, DPM

## 2016-05-12 ENCOUNTER — Other Ambulatory Visit: Payer: Self-pay | Admitting: Podiatry

## 2016-05-12 LAB — CULTURE, ROUTINE-ABSCESS
GRAM STAIN: NONE SEEN
GRAM STAIN: NONE SEEN

## 2016-05-12 MED ORDER — CLINDAMYCIN HCL 300 MG PO CAPS: 300.0000 mg | ORAL_CAPSULE | Freq: Four times a day (QID) | ORAL | 0 refills | Status: DC

## 2016-05-12 NOTE — Progress Notes (Signed)
Culture is showing MRSA. I sent clindamycin 300mg  qid to her pharmacy.

## 2016-05-13 ENCOUNTER — Ambulatory Visit (INDEPENDENT_AMBULATORY_CARE_PROVIDER_SITE_OTHER): Payer: Self-pay | Admitting: Podiatry

## 2016-05-13 ENCOUNTER — Telehealth: Payer: Self-pay | Admitting: *Deleted

## 2016-05-13 ENCOUNTER — Encounter: Payer: Self-pay | Admitting: Podiatry

## 2016-05-13 DIAGNOSIS — Z22322 Carrier or suspected carrier of Methicillin resistant Staphylococcus aureus: Secondary | ICD-10-CM

## 2016-05-13 DIAGNOSIS — T814XXD Infection following a procedure, subsequent encounter: Secondary | ICD-10-CM

## 2016-05-13 DIAGNOSIS — L97513 Non-pressure chronic ulcer of other part of right foot with necrosis of muscle: Secondary | ICD-10-CM

## 2016-05-13 DIAGNOSIS — IMO0001 Reserved for inherently not codable concepts without codable children: Secondary | ICD-10-CM

## 2016-05-13 NOTE — Telephone Encounter (Addendum)
-----   Message from Trula Slade, DPM sent at 05/12/2016  5:25 PM EST ----- Please let her know I called in clindamycin to her pharmacy due to MRSA. 05/13/2016-Unable to leave a message pt's voicemail box is not set up. 05/14/2016-Dr. Jacqualyn Posey ordered conforming roll gauze, sterile gauze 4 x 4, sterile saline for daily dressing changes of right foot L97.513 measuring 1.0 x 1.0 x 0.5cm with moderate exudate, from [facility]. Faxed orders to [facility].

## 2016-05-15 ENCOUNTER — Encounter: Payer: Self-pay | Admitting: Podiatry

## 2016-05-15 NOTE — Progress Notes (Signed)
Subjective: Monique [name] is a 26 y.o. is seen today in office s/p right foot I&D, wound excision/debridement preformed on 05/10/16. They state their pain is currently controlled. She has been NWB. She has continued on antibiotics. Denies any systemic complaints such as fevers, chills, nausea, vomiting. No calf pain, chest pain, shortness of breath.   Objective: General: No acute distress, AAOx3  DP/PT pulses palpable 2/4, CRT < 3 sec to all digits.  Protective sensation intact. Motor function intact.  Right foot: Wound is present submetatarsal 4 on the right foot. The wound base is granular and probes approximately 0.5 cm. There is no surrounding erythema, ascending cellulitis. There is no fluctuance or crepitus. There is no malodor. Mild tenderness to palpation of the wound.No other areas of tenderness to bilateral lower extremities.  No otherother open lesions or pre-ulcerative lesions.  No pain with calf compression, swelling, warmth, erythema.   Assessment and Plan:  Status post right foot surgery, doing well with no complications   -Treatment options discussed including all alternatives, risks, and complications -wound was clean today. The wound was dressed with a saline wet to dry dressing followed by dry sterile dressing. She is to continue this at home. I ordered supplies for her today through [facility]. I showed her how to do the dressing changes at home and she feels comfortable doing this. -Due to MRSA clindamycin was called into her pharmacy yesterday she will go pick this up. -Pain medication as needed. -Monitor for any clinical signs or symptoms of infection and DVT/PE and directed to call the office immediately should any occur or go to the ER. -Follow-up in 1 week or sooner if any problems arise. In the meantime, encouraged to call the office with any questions, concerns, change in symptoms.   Celesta Gentile, DPM

## 2016-05-17 ENCOUNTER — Other Ambulatory Visit: Payer: Managed Care, Other (non HMO)

## 2016-05-20 ENCOUNTER — Encounter: Payer: Self-pay | Admitting: Podiatry

## 2016-05-20 ENCOUNTER — Ambulatory Visit (INDEPENDENT_AMBULATORY_CARE_PROVIDER_SITE_OTHER): Payer: Managed Care, Other (non HMO) | Admitting: Podiatry

## 2016-05-20 VITALS — BP 119/75 | HR 77 | Resp 14

## 2016-05-20 DIAGNOSIS — IMO0001 Reserved for inherently not codable concepts without codable children: Secondary | ICD-10-CM

## 2016-05-20 DIAGNOSIS — T814XXD Infection following a procedure, subsequent encounter: Secondary | ICD-10-CM

## 2016-05-20 DIAGNOSIS — Z22322 Carrier or suspected carrier of Methicillin resistant Staphylococcus aureus: Secondary | ICD-10-CM

## 2016-05-22 NOTE — Progress Notes (Signed)
Subjective: Monique [name] is a 26 y.o. is seen today in office s/p right foot I&D, wound excision/debridement preformed on 05/10/16. She said that she is doing well she's ready to go back to work. She doesn't the wound is almost healed. She denies any redness or swelling. She has continue antibiotics. Denies any systemic complaints such as fevers, chills, nausea, vomiting. No calf pain, chest pain, shortness of breath.   Objective: General: No acute distress, AAOx3  DP/PT pulses palpable 2/4, CRT < 3 sec to all digits.  Protective sensation intact. Motor function intact.  Right foot: Wound is present submetatarsal 4 on the right foot. Wound appears to be on was completed healed it is very superficial. There is no drainage or pus. There is no swelling erythema, ascending cellulitis, pus was, crepitus, malodor. The sutures are intact. The wound overall appears to be almost fully healed is much improved. There is no clinical signs of infection today. No otherother open lesions or pre-ulcerative lesions.  No pain with calf compression, swelling, warmth, erythema.   Assessment and Plan:  Status post right foot surgery, doing well with no complications   -Treatment options discussed including all alternatives, risks, and complications -Sutures were removed today without complications. Overall the wound appears to be much improved. She can apply this a small metal in about ointment and a bandage. Continue this is the areas completely healed. She did Epsom salts soaks as well. She can return to work later this week history of pain. Monitoring signs or symptoms of recurrence of infection and call the office in medial should any occur. Follow-up as scheduled or sooner if needed.  Celesta Gentile, DPM

## 2016-05-29 NOTE — Progress Notes (Signed)
DOS 02.09.2018 Right foot incision and drainage.

## 2016-06-10 ENCOUNTER — Encounter: Payer: Managed Care, Other (non HMO) | Admitting: Podiatry

## 2016-06-13 ENCOUNTER — Ambulatory Visit (INDEPENDENT_AMBULATORY_CARE_PROVIDER_SITE_OTHER): Payer: Managed Care, Other (non HMO) | Admitting: Podiatry

## 2016-06-13 DIAGNOSIS — L84 Corns and callosities: Secondary | ICD-10-CM | POA: Diagnosis not present

## 2016-06-13 DIAGNOSIS — M2011 Hallux valgus (acquired), right foot: Secondary | ICD-10-CM

## 2016-06-17 NOTE — Progress Notes (Signed)
Subjective: Monique [name] is a 26 y.o. is seen today in office s/p right foot I&D, wound excision/debridement preformed on 05/10/16. Since last appointment should return back to work and she has no pain and she is not swelling or redness. She states the pain in the callus that she was having previously has resolved. She has noticed very minimal recurrence of the callus. Only pain she gets is occasionally along the bunion site. This is painful to pressure in shoes. Denies any systemic complaints such as fevers, chills, nausea, vomiting. No calf pain, chest pain, shortness of breath.   Objective: General: No acute distress, AAOx3  DP/PT pulses palpable 2/4, CRT < 3 sec to all digits.  Protective sensation intact. Motor function intact.  Right foot: Hyperkeratotic lesion right foot symmetrical for although minimal. Upon debridement there is no underlying ulceration, drainage or signs of infection. There is no tenderness to palpation. There is no surrounding redness or drainage or any signs of infection. Moderate HAV is present. There is no pain or crepitation first MP joint range of motion. No first ray hypermobility is present. No other open lesions or pre-ulcerative lesions.  No pain with calf compression, swelling, warmth, erythema.   Assessment and Plan:  Status post right foot surgery, doing well with no complications   -Treatment options discussed including all alternatives, risks, and complications -Hyperkeratotic lesion sharply debrided 1 without complications or bleeding. -Continue shoe gear modifications and offloading for the bunion. -This and is been no evidence of recurrence of the infection. Discussed thoroughly future possible surgical intervention however we will continue with conservative treatment for now.  Celesta Gentile, DPM

## 2017-05-13 ENCOUNTER — Encounter: Payer: Self-pay | Admitting: Physician Assistant

## 2017-05-13 ENCOUNTER — Ambulatory Visit (INDEPENDENT_AMBULATORY_CARE_PROVIDER_SITE_OTHER): Payer: BLUE CROSS/BLUE SHIELD | Admitting: Physician Assistant

## 2017-05-13 VITALS — BP 117/77 | HR 123 | Temp 97.6°F | Ht 64.0 in | Wt 148.4 lb

## 2017-05-13 DIAGNOSIS — R300 Dysuria: Secondary | ICD-10-CM

## 2017-05-13 DIAGNOSIS — N10 Acute tubulo-interstitial nephritis: Secondary | ICD-10-CM

## 2017-05-13 LAB — URINALYSIS, COMPLETE
Bilirubin, UA: NEGATIVE
Glucose, UA: NEGATIVE
KETONES UA: NEGATIVE
Nitrite, UA: NEGATIVE
PH UA: 6 (ref 5.0–7.5)
SPEC GRAV UA: 1.015 (ref 1.005–1.030)
Urobilinogen, Ur: 0.2 mg/dL (ref 0.2–1.0)

## 2017-05-13 LAB — MICROSCOPIC EXAMINATION: RENAL EPITHEL UA: NONE SEEN /HPF

## 2017-05-13 MED ORDER — CIPROFLOXACIN HCL 500 MG PO TABS: 500.0000 mg | ORAL_TABLET | Freq: Two times a day (BID) | ORAL | 0 refills | Status: DC

## 2017-05-13 NOTE — Patient Instructions (Signed)
Pyelonephritis, Adult Pyelonephritis is a kidney infection. The kidneys are organs that help clean your blood by moving waste out of your blood and into your pee (urine). This infection can happen quickly, or it can last for a long time. In most cases, it clears up with treatment and does not cause other problems. Follow these instructions at home: Medicines  Take over-the-counter and prescription medicines only as told by your doctor.  Take your antibiotic medicine as told by your doctor. Do not stop taking the medicine even if you start to feel better. General instructions  Drink enough fluid to keep your pee clear or pale yellow.  Avoid caffeine, tea, and carbonated drinks.  Pee (urinate) often. Avoid holding in pee for long periods of time.  Pee before and after sex.  After pooping (having a bowel movement), women should wipe from front to back. Use each tissue only once.  Keep all follow-up visits as told by your doctor. This is important. Contact a doctor if:  You do not feel better after 2 days.  Your symptoms get worse.  You have a fever. Get help right away if:  You cannot take your medicine or drink fluids as told.  You have chills and shaking.  You throw up (vomit).  You have very bad pain in your side (flank) or back.  You feel very weak or you pass out (faint). This information is not intended to replace advice given to you by your health care provider. Make sure you discuss any questions you have with your health care provider. Document Released: 04/25/2004 Document Revised: 08/24/2015 Document Reviewed: 07/11/2014 Elsevier Interactive Patient Education  2018 [employer] Inc[employer]  

## 2017-05-14 LAB — CBC WITH DIFFERENTIAL/PLATELET
BASOS ABS: 0 10*3/uL (ref 0.0–0.2)
Basos: 0 %
EOS (ABSOLUTE): 0 10*3/uL (ref 0.0–0.4)
Eos: 0 %
Hematocrit: 43 % (ref 34.0–46.6)
Hemoglobin: 14.6 g/dL (ref 11.1–15.9)
IMMATURE GRANS (ABS): 0 10*3/uL (ref 0.0–0.1)
Immature Granulocytes: 0 %
LYMPHS: 7 %
Lymphocytes Absolute: 1 10*3/uL (ref 0.7–3.1)
MCH: 33.3 pg — AB (ref 26.6–33.0)
MCHC: 34 g/dL (ref 31.5–35.7)
MCV: 98 fL — ABNORMAL HIGH (ref 79–97)
Monocytes Absolute: 1.3 10*3/uL — ABNORMAL HIGH (ref 0.1–0.9)
Monocytes: 10 %
NEUTROS ABS: 11.2 10*3/uL — AB (ref 1.4–7.0)
Neutrophils: 83 %
PLATELETS: 224 10*3/uL (ref 150–379)
RBC: 4.38 x10E6/uL (ref 3.77–5.28)
RDW: 13.1 % (ref 12.3–15.4)
WBC: 13.5 10*3/uL — ABNORMAL HIGH (ref 3.4–10.8)

## 2017-05-14 LAB — CMP14+EGFR
ALK PHOS: 61 IU/L (ref 39–117)
ALT: 10 IU/L (ref 0–32)
AST: 13 IU/L (ref 0–40)
Albumin/Globulin Ratio: 1.5 (ref 1.2–2.2)
Albumin: 4.2 g/dL (ref 3.5–5.5)
BILIRUBIN TOTAL: 0.6 mg/dL (ref 0.0–1.2)
BUN/Creatinine Ratio: 11 (ref 9–23)
BUN: 10 mg/dL (ref 6–20)
CHLORIDE: 100 mmol/L (ref 96–106)
CO2: 21 mmol/L (ref 20–29)
CREATININE: 0.89 mg/dL (ref 0.57–1.00)
Calcium: 9.7 mg/dL (ref 8.7–10.2)
GFR calc Af Amer: 103 mL/min/{1.73_m2} (ref >59)
GFR calc non Af Amer: 89 mL/min/{1.73_m2} (ref >59)
GLUCOSE: 101 mg/dL — AB (ref 65–99)
Globulin, Total: 2.8 g/dL (ref 1.5–4.5)
Potassium: 3.8 mmol/L (ref 3.5–5.2)
Sodium: 138 mmol/L (ref 134–144)
TOTAL PROTEIN: 7 g/dL (ref 6.0–8.5)

## 2017-05-15 LAB — URINE CULTURE

## 2017-05-19 NOTE — Progress Notes (Signed)
BP 117/77   Pulse (!) 123   Temp 97.6 F (36.4 C) (Oral)   Ht '5\' 4"'$  (1.626 m)   Wt 148 lb 6.4 oz (67.3 kg)   BMI 25.47 kg/m    Subjective:    Patient ID: Monique [name], female    DOB: 15-Feb-1991, 27 y.o.   MRN: [id number]  HPI: Monique [name] is a 27 y.o. female presenting on 05/13/2017 for New Patient (Initial Visit) (Establish Care ) and Back Pain (radiating to abdomen )  This patient has had several days of dysuria, frequency and nocturia. There is also pain over the bladder in the suprapubic region, no back pain. Denies leakage or hematuria.  Denies fever or chills. Some pain in flank area. No history of kidneys stones. Explained to patient.  She is instructed that if any symptom is severely worsened with pain and uncontrollable fever she should report to the emergency department.  Past Medical History:  Diagnosis Date  . Allergy   . Anemia    Relevant past medical, surgical, family and social history reviewed and updated as indicated. Interim medical history since our last visit reviewed. Allergies and medications reviewed and updated. DATA REVIEWED: CHART IN EPIC  Family History reviewed for pertinent findings.  Review of Systems  Constitutional: Negative.   HENT: Negative.   Eyes: Negative.   Respiratory: Negative.   Gastrointestinal: Negative.   Genitourinary: Positive for difficulty urinating, dysuria, flank pain, frequency and urgency.    Allergies as of 05/13/2017   No Known Allergies     Medication List        Accurate as of 05/13/17 11:59 PM. Always use your most recent med list.          ciprofloxacin 500 MG tablet Commonly known as:  CIPRO Take 1 tablet (500 mg total) by mouth 2 (two) times daily.          Objective:    BP 117/77   Pulse (!) 123   Temp 97.6 F (36.4 C) (Oral)   Ht '5\' 4"'$  (1.626 m)   Wt 148 lb 6.4 oz (67.3 kg)   BMI 25.47 kg/m   No Known Allergies  Wt Readings from Last 3 Encounters:  05/13/17 148 lb 6.4 oz (67.3  kg)  05/05/16 160 lb (72.6 kg)  01/09/16 140 lb (63.5 kg)    Physical Exam  Constitutional: She is oriented to person, place, and time. She appears well-developed and well-nourished.  HENT:  Head: Normocephalic and atraumatic.  Eyes: Conjunctivae are normal. Pupils are equal, round, and reactive to light.  Cardiovascular: Normal rate, regular rhythm, normal heart sounds and intact distal pulses.  Pulmonary/Chest: Effort normal and breath sounds normal.  Abdominal: Soft. Bowel sounds are normal. She exhibits no distension and no mass. There is tenderness in the suprapubic area. There is no rebound, no guarding and no CVA tenderness.  Neurological: She is alert and oriented to person, place, and time. She has normal reflexes.  Skin: Skin is warm and dry. No rash noted.  Psychiatric: She has a normal mood and affect. Her behavior is normal. Judgment and thought content normal.    Results for orders placed or performed in visit on 05/13/17  Urine Culture  Result Value Ref Range   Urine Culture, Routine Final report (A)    Organism ID, Bacteria Escherichia coli (A)    Antimicrobial Susceptibility Comment   Microscopic Examination  Result Value Ref Range   WBC, UA >30 (A) 0 - 5 /hpf  RBC, UA 3-10 (A) 0 - 2 /hpf   Epithelial Cells (non renal) >10 (A) 0 - 10 /hpf   Renal Epithel, UA None seen None seen /hpf   Bacteria, UA Moderate (A) None seen/Few  Urinalysis, Complete  Result Value Ref Range   Specific Gravity, UA 1.015 1.005 - 1.030   pH, UA 6.0 5.0 - 7.5   Color, UA Yellow Yellow   Appearance Ur Clear Clear   Leukocytes, UA 1+ (A) Negative   Protein, UA 3+ (A) Negative/Trace   Glucose, UA Negative Negative   Ketones, UA Negative Negative   RBC, UA 2+ (A) Negative   Bilirubin, UA Negative Negative   Urobilinogen, Ur 0.2 0.2 - 1.0 mg/dL   Nitrite, UA Negative Negative   Microscopic Examination See below:   CBC with Differential/Platelet  Result Value Ref Range   WBC 13.5 (H)  3.4 - 10.8 x10E3/uL   RBC 4.38 3.77 - 5.28 x10E6/uL   Hemoglobin 14.6 11.1 - 15.9 g/dL   Hematocrit 43.0 34.0 - 46.6 %   MCV 98 (H) 79 - 97 fL   MCH 33.3 (H) 26.6 - 33.0 pg   MCHC 34.0 31.5 - 35.7 g/dL   RDW 13.1 12.3 - 15.4 %   Platelets 224 150 - 379 x10E3/uL   Neutrophils 83 Not Estab. %   Lymphs 7 Not Estab. %   Monocytes 10 Not Estab. %   Eos 0 Not Estab. %   Basos 0 Not Estab. %   Neutrophils Absolute 11.2 (H) 1.4 - 7.0 x10E3/uL   Lymphocytes Absolute 1.0 0.7 - 3.1 x10E3/uL   Monocytes Absolute 1.3 (H) 0.1 - 0.9 x10E3/uL   EOS (ABSOLUTE) 0.0 0.0 - 0.4 x10E3/uL   Basophils Absolute 0.0 0.0 - 0.2 x10E3/uL   Immature Granulocytes 0 Not Estab. %   Immature Grans (Abs) 0.0 0.0 - 0.1 x10E3/uL  CMP14+EGFR  Result Value Ref Range   Glucose 101 (H) 65 - 99 mg/dL   BUN 10 6 - 20 mg/dL   Creatinine, Ser 0.89 0.57 - 1.00 mg/dL   GFR calc non Af Amer 89 >59 mL/min/1.73   GFR calc Af Amer 103 >59 mL/min/1.73   BUN/Creatinine Ratio 11 9 - 23   Sodium 138 134 - 144 mmol/L   Potassium 3.8 3.5 - 5.2 mmol/L   Chloride 100 96 - 106 mmol/L   CO2 21 20 - 29 mmol/L   Calcium 9.7 8.7 - 10.2 mg/dL   Total Protein 7.0 6.0 - 8.5 g/dL   Albumin 4.2 3.5 - 5.5 g/dL   Globulin, Total 2.8 1.5 - 4.5 g/dL   Albumin/Globulin Ratio 1.5 1.2 - 2.2   Bilirubin Total 0.6 0.0 - 1.2 mg/dL   Alkaline Phosphatase 61 39 - 117 IU/L   AST 13 0 - 40 IU/L   ALT 10 0 - 32 IU/L      Assessment & Plan:   1. Dysuria - Urine Culture - Urinalysis, Complete - Microscopic Examination  2. Acute pyelonephritis - CBC with Differential/Platelet - CMP14+EGFR - ciprofloxacin (CIPRO) 500 MG tablet; Take 1 tablet (500 mg total) by mouth 2 (two) times daily.  Dispense: 20 tablet; Refill: 0   Continue all other maintenance medications as listed above.  Follow up plan: Return if symptoms worsen or fail to improve.  Educational handout given for UTI  Terald Sleeper PA-C [facility] [address]  [place], [place] [address] [phone]   05/19/2017, 12:46 PM

## 2017-09-26 ENCOUNTER — Encounter (HOSPITAL_COMMUNITY): Payer: Self-pay | Admitting: *Deleted

## 2017-09-26 ENCOUNTER — Other Ambulatory Visit: Payer: Self-pay

## 2017-09-26 DIAGNOSIS — R102 Pelvic and perineal pain: Secondary | ICD-10-CM | POA: Insufficient documentation

## 2017-09-26 DIAGNOSIS — F17210 Nicotine dependence, cigarettes, uncomplicated: Secondary | ICD-10-CM | POA: Insufficient documentation

## 2017-09-26 DIAGNOSIS — K5900 Constipation, unspecified: Secondary | ICD-10-CM | POA: Insufficient documentation

## 2017-09-26 NOTE — ED Triage Notes (Signed)
Pt reports two days of lower abdominal intermittent cramping pain. She reports she currently has Mirena and it feels like it is not in the right spot. Denies vaginal bleeding or discharge. She has had to have a previous Mirena removed and it feels the same way.

## 2017-09-27 ENCOUNTER — Emergency Department (HOSPITAL_COMMUNITY)
Admission: EM | Admit: 2017-09-27 | Discharge: 2017-09-27 | Disposition: A | Discharge status: Home / Self Care | Payer: Self-pay | Attending: Emergency Medicine | Admitting: Emergency Medicine

## 2017-09-27 ENCOUNTER — Emergency Department (HOSPITAL_COMMUNITY): Payer: Self-pay

## 2017-09-27 DIAGNOSIS — K5900 Constipation, unspecified: Secondary | ICD-10-CM

## 2017-09-27 DIAGNOSIS — R102 Pelvic and perineal pain: Secondary | ICD-10-CM

## 2017-09-27 LAB — CBC WITH DIFFERENTIAL/PLATELET
BASOS ABS: 0 10*3/uL (ref 0.0–0.1)
BASOS PCT: 0 %
EOS ABS: 0.3 10*3/uL (ref 0.0–0.7)
Eosinophils Relative: 3 %
HEMATOCRIT: 44.6 % (ref 36.0–46.0)
HEMOGLOBIN: 15.4 g/dL — AB (ref 12.0–15.0)
Lymphocytes Relative: 46 %
Lymphs Abs: 4.5 10*3/uL — ABNORMAL HIGH (ref 0.7–4.0)
MCH: 35.1 pg — ABNORMAL HIGH (ref 26.0–34.0)
MCHC: 34.5 g/dL (ref 30.0–36.0)
MCV: 101.6 fL — ABNORMAL HIGH (ref 78.0–100.0)
MONO ABS: 0.5 10*3/uL (ref 0.1–1.0)
MONOS PCT: 5 %
NEUTROS ABS: 4.5 10*3/uL (ref 1.7–7.7)
NEUTROS PCT: 46 %
Platelets: 272 10*3/uL (ref 150–400)
RBC: 4.39 MIL/uL (ref 3.87–5.11)
RDW: 12.4 % (ref 11.5–15.5)
WBC: 9.8 10*3/uL (ref 4.0–10.5)

## 2017-09-27 LAB — WET PREP, GENITAL
CLUE CELLS WET PREP: NONE SEEN
SPERM: NONE SEEN
Trich, Wet Prep: NONE SEEN
YEAST WET PREP: NONE SEEN

## 2017-09-27 LAB — BASIC METABOLIC PANEL
ANION GAP: 8 (ref 5–15)
BUN: 9 mg/dL (ref 6–20)
CALCIUM: 8.9 mg/dL (ref 8.9–10.3)
CO2: 25 mmol/L (ref 22–32)
CREATININE: 0.68 mg/dL (ref 0.44–1.00)
Chloride: 110 mmol/L (ref 98–111)
GFR calc non Af Amer: >60 mL/min (ref >60)
Glucose, Bld: 91 mg/dL (ref 70–99)
Potassium: 4 mmol/L (ref 3.5–5.1)
SODIUM: 143 mmol/L (ref 135–145)

## 2017-09-27 LAB — URINALYSIS, ROUTINE W REFLEX MICROSCOPIC
BILIRUBIN URINE: NEGATIVE
Glucose, UA: NEGATIVE mg/dL
HGB URINE DIPSTICK: NEGATIVE
Ketones, ur: NEGATIVE mg/dL
Leukocytes, UA: NEGATIVE
Nitrite: NEGATIVE
Protein, ur: NEGATIVE mg/dL
Specific Gravity, Urine: 1.002 — ABNORMAL LOW (ref 1.005–1.030)
pH: 6 (ref 5.0–8.0)

## 2017-09-27 LAB — POC URINE PREG, ED: Preg Test, Ur: NEGATIVE

## 2017-09-27 MED ORDER — AZITHROMYCIN 250 MG PO TABS
1000.0000 mg | ORAL_TABLET | Freq: Once | ORAL | Status: AC
  Administered 2017-09-27: 1000 mg via ORAL
  Filled 2017-09-27: qty 4

## 2017-09-27 MED ORDER — IBUPROFEN 800 MG PO TABS: 800.0000 mg | ORAL_TABLET | Freq: Three times a day (TID) | ORAL | 0 refills | Status: DC | PRN

## 2017-09-27 MED ORDER — STERILE WATER FOR INJECTION IJ SOLN
INTRAMUSCULAR | Status: AC
  Administered 2017-09-27: 10 mL
  Filled 2017-09-27: qty 10

## 2017-09-27 MED ORDER — POLYETHYLENE GLYCOL 3350 17 G PO PACK: 17.0000 g | PACK | Freq: Two times a day (BID) | ORAL | 0 refills | Status: DC | PRN

## 2017-09-27 MED ORDER — CEFTRIAXONE SODIUM 250 MG IJ SOLR
250.0000 mg | Freq: Once | INTRAMUSCULAR | Status: AC
Start: 2017-09-27 — End: 2017-09-27
  Administered 2017-09-27: 250 mg via INTRAMUSCULAR
  Filled 2017-09-27: qty 250

## 2017-09-27 MED ORDER — IOPAMIDOL (ISOVUE-300) INJECTION 61%
100.0000 mL | Freq: Once | INTRAVENOUS | Status: AC | PRN
  Administered 2017-09-27: 100 mL via INTRAVENOUS

## 2017-09-27 MED ORDER — TRAMADOL HCL 50 MG PO TABS: 50.0000 mg | ORAL_TABLET | Freq: Four times a day (QID) | ORAL | 0 refills | Status: DC | PRN

## 2017-09-27 MED ORDER — METRONIDAZOLE 500 MG PO TABS
2000.0000 mg | ORAL_TABLET | Freq: Once | ORAL | Status: AC
  Administered 2017-09-27: 2000 mg via ORAL
  Filled 2017-09-27: qty 4

## 2017-09-27 NOTE — ED Notes (Signed)
Patient transported to CT 

## 2017-09-27 NOTE — ED Notes (Signed)
Per EDP, hold meds at this time until ct scan has resulted

## 2017-09-27 NOTE — ED Provider Notes (Signed)
[facility] EMERGENCY DEPARTMENT Provider Note   CSN: [id number] Arrival date & time: 09/26/17  2253     History   Chief Complaint Chief Complaint  Patient presents with  . Abdominal Pain    HPI Monique [name] is a 27 y.o. female.  Patient presents to the emergency department for evaluation of pelvic pain.  Patient reports that she has been having several days of sharp stabbing pains across the lower abdomen and pelvis.  She has not identified anything that causes the pain.  She is concerned, however, because it feels similar to when she had a problem necessitating removal of an IUD.  Patient has recently had a Mirena placed.  She did have a menstrual period last month but it was slightly late.  No discharge or spotting.  Denies urinary symptoms.     Past Medical History:  Diagnosis Date  . Allergy   . Anemia     Patient Active Problem List   Diagnosis Date Noted  . Cellulitis and abscess of foot, except toes 05/10/2016    History reviewed. No pertinent surgical history.   OB History    Gravida  1   Para  0   Term  0   Preterm  0   AB  0   Living  1     SAB  0   TAB  0   Ectopic  0   Multiple  0   Live Births               Home Medications    Prior to Admission medications   Medication Sig Start Date End Date Taking? Authorizing Provider  ciprofloxacin (CIPRO) 500 MG tablet Take 1 tablet (500 mg total) by mouth 2 (two) times daily. 05/13/17   Terald Sleeper, PA-C  ibuprofen (ADVIL,MOTRIN) 800 MG tablet Take 1 tablet (800 mg total) by mouth every 8 (eight) hours as needed for moderate pain or cramping. 09/27/17   Pollina, Gwenyth Allegra, MD  traMADol (ULTRAM) 50 MG tablet Take 1 tablet (50 mg total) by mouth every 6 (six) hours as needed for severe pain. 09/27/17   Orpah Greek, MD    Family History No family history on file.  Social History Social History   Tobacco Use  . Smoking status: Current Every Day Smoker  . Smokeless  tobacco: Never Used  Substance Use Topics  . Alcohol use: Yes  . Drug use: No     Allergies   Patient has no known allergies.   Review of Systems Review of Systems  Genitourinary: Positive for pelvic pain.  All other systems reviewed and are negative.    Physical Exam Updated Vital Signs BP (!) 126/97 (BP Location: Right Arm)   Pulse 85   Temp 99.7 F (37.6 C) (Oral)   Resp 18   Ht 5\' 4"  (1.626 m)   Wt 63.5 kg (140 lb)   LMP 07/30/2017   SpO2 96%   BMI 24.03 kg/m   Physical Exam  Constitutional: She is oriented to person, place, and time. She appears well-developed and well-nourished. No distress.  HENT:  Head: Normocephalic and atraumatic.  Right Ear: Hearing normal.  Left Ear: Hearing normal.  Nose: Nose normal.  Mouth/Throat: Oropharynx is clear and moist and mucous membranes are normal.  Eyes: Pupils are equal, round, and reactive to light. Conjunctivae and EOM are normal.  Neck: Normal range of motion. Neck supple.  Cardiovascular: Regular rhythm, S1 normal and S2 normal. Exam reveals no  gallop and no friction rub.  No murmur heard. Pulmonary/Chest: Effort normal and breath sounds normal. No respiratory distress. She exhibits no tenderness.  Abdominal: Soft. Normal appearance and bowel sounds are normal. There is no hepatosplenomegaly. There is no tenderness. There is no rebound, no guarding, no tenderness at McBurney's point and negative Murphy's sign. No hernia.  Genitourinary: Vagina normal and uterus normal. Cervix exhibits motion tenderness. Cervix exhibits no discharge and no friability. Right adnexum displays tenderness. Right adnexum displays no mass. Left adnexum displays no mass and no tenderness.  Musculoskeletal: Normal range of motion.  Neurological: She is alert and oriented to person, place, and time. She has normal strength. No cranial nerve deficit or sensory deficit. Coordination normal. GCS eye subscore is 4. GCS verbal subscore is 5. GCS motor  subscore is 6.  Skin: Skin is warm, dry and intact. No rash noted. No cyanosis.  Psychiatric: She has a normal mood and affect. Her speech is normal and behavior is normal. Thought content normal.  Nursing note and vitals reviewed.    ED Treatments / Results  Labs (all labs ordered are listed, but only abnormal results are displayed) Labs Reviewed  URINALYSIS, ROUTINE W REFLEX MICROSCOPIC - Abnormal; Notable for the following components:      Result Value   Color, Urine COLORLESS (*)    Specific Gravity, Urine 1.002 (*)    All other components within normal limits  WET PREP, GENITAL  POC URINE PREG, ED  GC/CHLAMYDIA PROBE AMP ([place]) NOT AT [facility]    EKG None  Radiology No results found.  Procedures Procedures (including critical care time)  Medications Ordered in ED Medications  cefTRIAXone (ROCEPHIN) injection 250 mg (has no administration in time range)  azithromycin (ZITHROMAX) tablet 1,000 mg (has no administration in time range)  metroNIDAZOLE (FLAGYL) tablet 2,000 mg (has no administration in time range)     Initial Impression / Assessment and Plan / ED Course  I have reviewed the triage vital signs and the nursing notes.  Pertinent labs & imaging results that were available during my care of the patient were reviewed by me and considered in my medical decision making (see chart for details).     Patient with pelvic pain for several days.  Vital signs are stable.  Abdominal exam is benign, no concern for intra-abdominal pathology or acute surgical process in the abdomen.  She reports that the pain feels similar to when she had pelvic pain secondary to an IUD.  She is concerned that the IUD has migrated.  Pelvic exam reveals a normal, closed cervical loss with IUD string visible.  There is some cervical motion tenderness without discharge.  She had tenderness in the right adnexal area, but no mass present.    Was also, however, some tenderness in the right  lower quadrant.  Blood work and CT scan performed.  No evidence of appendicitis.  No adnexal abnormalities, IUD is seen in appropriate placement.  Will empirically cover with antibiotics, referred back to her OB/GYN at [facility] in [place].  Final Clinical Impressions(s) / ED Diagnoses   Final diagnoses:  Pelvic pain in female    ED Discharge Orders        Ordered    ibuprofen (ADVIL,MOTRIN) 800 MG tablet  Every 8 hours PRN     09/27/17 0146    traMADol (ULTRAM) 50 MG tablet  Every 6 hours PRN     09/27/17 0146       Joseph Berkshire  J, MD 09/27/17 8412

## 2017-09-29 LAB — GC/CHLAMYDIA PROBE AMP (~~LOC~~) NOT AT ARMC
Chlamydia: NEGATIVE
Neisseria Gonorrhea: NEGATIVE

## 2018-06-10 ENCOUNTER — Emergency Department (HOSPITAL_COMMUNITY)
Admission: EM | Admit: 2018-06-10 | Discharge: 2018-06-10 | Disposition: A | Discharge status: Home / Self Care | Payer: BC Managed Care – PPO | Attending: Emergency Medicine | Admitting: Emergency Medicine

## 2018-06-10 ENCOUNTER — Other Ambulatory Visit: Payer: Self-pay

## 2018-06-10 ENCOUNTER — Encounter (HOSPITAL_COMMUNITY): Payer: Self-pay | Admitting: Emergency Medicine

## 2018-06-10 DIAGNOSIS — F17200 Nicotine dependence, unspecified, uncomplicated: Secondary | ICD-10-CM | POA: Insufficient documentation

## 2018-06-10 DIAGNOSIS — N939 Abnormal uterine and vaginal bleeding, unspecified: Secondary | ICD-10-CM | POA: Diagnosis not present

## 2018-06-10 LAB — URINALYSIS, ROUTINE W REFLEX MICROSCOPIC
Bilirubin Urine: NEGATIVE
GLUCOSE, UA: NEGATIVE mg/dL
HGB URINE DIPSTICK: NEGATIVE
Ketones, ur: NEGATIVE mg/dL
LEUKOCYTE UA: NEGATIVE
Nitrite: NEGATIVE
PROTEIN: NEGATIVE mg/dL
Specific Gravity, Urine: 1.003 — ABNORMAL LOW (ref 1.005–1.030)
pH: 6 (ref 5.0–8.0)

## 2018-06-10 LAB — CBC WITH DIFFERENTIAL/PLATELET
Abs Immature Granulocytes: 0.06 10*3/uL (ref 0.00–0.07)
BASOS PCT: 1 %
Basophils Absolute: 0.1 10*3/uL (ref 0.0–0.1)
EOS ABS: 0.2 10*3/uL (ref 0.0–0.5)
Eosinophils Relative: 2 %
HCT: 44.4 % (ref 36.0–46.0)
Hemoglobin: 14.8 g/dL (ref 12.0–15.0)
Immature Granulocytes: 1 %
Lymphocytes Relative: 36 %
Lymphs Abs: 3 10*3/uL (ref 0.7–4.0)
MCH: 34.3 pg — AB (ref 26.0–34.0)
MCHC: 33.3 g/dL (ref 30.0–36.0)
MCV: 103 fL — AB (ref 80.0–100.0)
MONO ABS: 0.4 10*3/uL (ref 0.1–1.0)
MONOS PCT: 5 %
Neutro Abs: 4.7 10*3/uL (ref 1.7–7.7)
Neutrophils Relative %: 55 %
PLATELETS: 232 10*3/uL (ref 150–400)
RBC: 4.31 MIL/uL (ref 3.87–5.11)
RDW: 12 % (ref 11.5–15.5)
WBC: 8.4 10*3/uL (ref 4.0–10.5)
nRBC: 0 % (ref 0.0–0.2)

## 2018-06-10 LAB — BASIC METABOLIC PANEL
Anion gap: 7 (ref 5–15)
BUN: 15 mg/dL (ref 6–20)
CALCIUM: 9.6 mg/dL (ref 8.9–10.3)
CO2: 25 mmol/L (ref 22–32)
Chloride: 103 mmol/L (ref 98–111)
Creatinine, Ser: 0.8 mg/dL (ref 0.44–1.00)
GFR calc Af Amer: >60 mL/min (ref >60)
GLUCOSE: 92 mg/dL (ref 70–99)
Potassium: 4.8 mmol/L (ref 3.5–5.1)
Sodium: 135 mmol/L (ref 135–145)

## 2018-06-10 MED ORDER — KETOROLAC TROMETHAMINE 30 MG/ML IJ SOLN
30.0000 mg | Freq: Once | INTRAMUSCULAR | Status: AC
Start: 2018-06-10 — End: 2018-06-10
  Administered 2018-06-10: 30 mg via INTRAMUSCULAR
  Filled 2018-06-10: qty 1

## 2018-06-10 NOTE — ED Provider Notes (Signed)
[facility] EMERGENCY DEPARTMENT Provider Note   CSN: [id number] Arrival date & time: 06/10/18  1623    History   Chief Complaint Chief Complaint  Patient presents with  . Abdominal Pain    HPI Monique [name] is a 28 y.o. female.     HPI  Presents with concern of ongoing vaginal bleeding and abdominal discomfort. Abdominal discomfort is vague, lower, mild, without associated fever, vomiting, diarrhea. Patient notes that she has a Mirena device in place. She notes that she has been bleeding intermittently for about 5 weeks. Long ago the patient had a Mirena, had a similar bleeding episode, prompting removal of the device. However, she elected to have another device placed for birth control measures. She notes that during this 5-week period of bleeding she has had some abdominal pain, continues to have sex with frequency. She has attempted to follow-up with her gynecologist, unsuccessfully thus far.   Past Medical History:  Diagnosis Date  . Allergy   . Anemia     Patient Active Problem List   Diagnosis Date Noted  . Cellulitis and abscess of foot, except toes 05/10/2016    History reviewed. No pertinent surgical history.   OB History    Gravida  1   Para  0   Term  0   Preterm  0   AB  0   Living  1     SAB  0   TAB  0   Ectopic  0   Multiple  0   Live Births               Home Medications    Prior to Admission medications   Medication Sig Start Date End Date Taking? Authorizing Provider  ciprofloxacin (CIPRO) 500 MG tablet Take 1 tablet (500 mg total) by mouth 2 (two) times daily. 05/13/17   Terald Sleeper, PA-C  ibuprofen (ADVIL,MOTRIN) 800 MG tablet Take 1 tablet (800 mg total) by mouth every 8 (eight) hours as needed for moderate pain or cramping. 09/27/17   Pollina, Gwenyth Allegra, MD  polyethylene glycol (MIRALAX / GLYCOLAX) packet Take 17 g by mouth 2 (two) times daily as needed for moderate constipation. 09/27/17   Orpah Greek, MD    Family History History reviewed. No pertinent family history.  Social History Social History   Tobacco Use  . Smoking status: Current Every Day Smoker  . Smokeless tobacco: Never Used  Substance Use Topics  . Alcohol use: Yes    Comment: occasionally  . Drug use: No     Allergies   Patient has no known allergies.   Review of Systems Review of Systems  Constitutional:       Per HPI, otherwise negative  HENT:       Per HPI, otherwise negative  Respiratory:       Per HPI, otherwise negative  Cardiovascular:       Per HPI, otherwise negative  Gastrointestinal: Negative for vomiting.  Endocrine:       Negative aside from HPI  Genitourinary:       Neg aside from HPI   Musculoskeletal:       Per HPI, otherwise negative  Skin: Negative.   Neurological: Negative for syncope.     Physical Exam Updated Vital Signs BP 116/74 (BP Location: Left Arm)   Pulse 77   Temp 98.4 F (36.9 C) (Oral)   Resp 18   Ht 5\' 4"  (1.626 m)   Wt 68 kg  SpO2 96%   BMI 25.75 kg/m   Physical Exam Vitals signs and nursing note reviewed.  Constitutional:      General: She is not in acute distress.    Appearance: She is well-developed.  HENT:     Head: Normocephalic and atraumatic.  Eyes:     Conjunctiva/sclera: Conjunctivae normal.  Cardiovascular:     Rate and Rhythm: Normal rate and regular rhythm.  Pulmonary:     Effort: Pulmonary effort is normal. No respiratory distress.     Breath sounds: Normal breath sounds. No stridor.  Abdominal:     General: There is no distension.     Tenderness: There is no abdominal tenderness.  Skin:    General: Skin is warm and dry.  Neurological:     Mental Status: She is alert and oriented to person, place, and time.     Cranial Nerves: No cranial nerve deficit.      ED Treatments / Results  Labs (all labs ordered are listed, but only abnormal results are displayed) Labs Reviewed  URINALYSIS, ROUTINE W REFLEX  MICROSCOPIC - Abnormal; Notable for the following components:      Result Value   Color, Urine STRAW (*)    Specific Gravity, Urine 1.003 (*)    All other components within normal limits  CBC WITH DIFFERENTIAL/PLATELET - Abnormal; Notable for the following components:   MCV 103.0 (*)    MCH 34.3 (*)    All other components within normal limits  BASIC METABOLIC PANEL     Procedures Procedures (including critical care time)  Medications Ordered in ED Medications  ketorolac (TORADOL) 30 MG/ML injection 30 mg (30 mg Intramuscular Given 06/10/18 1759)     Initial Impression / Assessment and Plan / ED Course  I have reviewed the triage vital signs and the nursing notes.  Pertinent labs & imaging results that were available during my care of the patient were reviewed by me and considered in my medical decision making (see chart for details).        6:30 PM On repeat and the patient is in no distress. We discussed all findings length.  This well-appearing young female presents with vaginal bleeding for weeks, some abdominal pain, though none on exam. She is hemodynamically unremarkable, awake and alert, with a non-peritoneal abdomen, hemoglobin value that is baseline. Given ongoing bleeding, but absence of substantial exsanguination, patient is appropriate for next day follow-up with her gynecologist or our affiliated women's health staff. Patient encouraged to avoid additional intercourse pending outpatient follow-up.   Final Clinical Impressions(s) / ED Diagnoses   Final diagnoses:  Vaginal bleeding     Carmin Muskrat, MD 06/10/18 1831

## 2018-06-10 NOTE — ED Triage Notes (Signed)
Patient complaining of lower abdominal pain x 2 months with vaginal bleeding for a month last month and "spotting" this month. States she has mirena and had same problems previously and had to have mirena removed.

## 2018-06-10 NOTE — Discharge Instructions (Addendum)
As discussed, it is important that you follow-up with either your original gynecologist or with our affiliated gynecology colleagues. Return here for concerning changes in your condition.

## 2019-05-25 ENCOUNTER — Encounter: Payer: BC Managed Care – PPO | Admitting: Adult Health

## 2020-11-28 ENCOUNTER — Other Ambulatory Visit: Payer: Self-pay

## 2020-11-28 ENCOUNTER — Ambulatory Visit
Admission: EM | Admit: 2020-11-28 | Discharge: 2020-11-28 | Disposition: A | Discharge status: Home / Self Care | Payer: BC Managed Care – PPO | Attending: Physician Assistant | Admitting: Physician Assistant

## 2020-11-28 DIAGNOSIS — S0501XA Injury of conjunctiva and corneal abrasion without foreign body, right eye, initial encounter: Secondary | ICD-10-CM

## 2020-11-28 MED ORDER — TOBRAMYCIN 0.3 % OP SOLN: 1.0000 [drp] | OPHTHALMIC | 0 refills | Status: AC

## 2020-11-28 NOTE — Discharge Instructions (Addendum)
Return if any problems.

## 2020-11-28 NOTE — ED Triage Notes (Signed)
Pt presents with right eye irritation after baby accidentally stuck finger in eye

## 2021-01-25 ENCOUNTER — Ambulatory Visit (INDEPENDENT_AMBULATORY_CARE_PROVIDER_SITE_OTHER): Payer: Medicaid Other

## 2021-01-25 ENCOUNTER — Other Ambulatory Visit: Payer: Self-pay

## 2021-01-25 ENCOUNTER — Ambulatory Visit: Payer: Medicaid Other | Admitting: Podiatry

## 2021-01-25 VITALS — BP 129/75 | HR 87 | Temp 98.2°F

## 2021-01-25 DIAGNOSIS — M779 Enthesopathy, unspecified: Secondary | ICD-10-CM | POA: Diagnosis not present

## 2021-01-25 DIAGNOSIS — M21611 Bunion of right foot: Secondary | ICD-10-CM

## 2021-01-25 DIAGNOSIS — M216X1 Other acquired deformities of right foot: Secondary | ICD-10-CM

## 2021-01-25 DIAGNOSIS — M21619 Bunion of unspecified foot: Secondary | ICD-10-CM

## 2021-01-25 DIAGNOSIS — M21612 Bunion of left foot: Secondary | ICD-10-CM

## 2021-01-25 DIAGNOSIS — M7751 Other enthesopathy of right foot: Secondary | ICD-10-CM | POA: Diagnosis not present

## 2021-01-25 NOTE — Patient Instructions (Addendum)
Bunion A bunion (hallux valgus) is a bump that forms slowly on the inner side of the big toe joint. It occurs when the big toe turns toward the second toe. Bunions may be small at first, but they often get larger over time. They can make walking painful. What are the causes? This condition may be caused by: Wearing narrow or pointed shoes that force the big toe to press against the other toes. Abnormal foot development that causes the foot to roll inward. Changes in the foot that are caused by certain diseases, such as rheumatoid arthritis or polio. A foot injury. What increases the risk? The following factors may make you more likely to develop this condition: Wearing shoes that squeeze the toes together. Having certain diseases, such as: Rheumatoid arthritis. Polio. Cerebral palsy. Having family members who have bunions. Being born with abnormally shaped feet (a foot deformity), such as flat feet or low arches. Doing activities that put a lot of pressure on the feet, such as ballet dancing. What are the signs or symptoms? The main symptom of this condition is a bump on your big toe that you can notice. Other symptoms may include: Pain. Redness and inflammation around your big toe. Thick or hardened skin on your big toe or between your toes. Stiffness or loss of motion in your big toe. Trouble with walking. How is this diagnosed? This condition may be diagnosed based on your symptoms, medical history, and activities. You may also have tests and imaging, such as: X-rays. These allow your health care provider to check the position of the bones in your foot and look for damage to your joint. They also help your health care provider determine the severity of your bunion and the best way to treat it. Joint aspiration. In this test, a sample of fluid is removed from the toe joint. This test may be done if you are in a lot of pain. It helps rule out diseases that cause painful swelling of the  joints, such as arthritis or gout. How is this treated? Treatment depends on the severity of your symptoms. The goal of treatment is to relieve symptoms and prevent your bunion from getting worse. Your health care provider may recommend: Wearing shoes that have a wide toe box, or using bunion pads to cushion the affected area. Taping your toes together to keep them in a normal position. Placing a device inside your shoe (orthotic device) to help reduce pressure on your toe joint. Taking medicine to ease pain and inflammation. Putting ice or heat on the affected area. Doing stretching exercises. Surgery, for severe cases. Follow these instructions at home: Managing pain, stiffness, and swelling   If directed, put ice on the painful area. To do this: Put ice in a plastic bag. Place a towel between your skin and the bag. Leave the ice on for 20 minutes, 2-3 times a day. Remove the ice if your skin turns bright red. This is very important. If you cannot feel pain, heat, or cold, you have a greater risk of damage to the area. If directed, apply heat to the affected area before you exercise. Use the heat source that your health care provider recommends, such as a moist heat pack or a heating pad. Place a towel between your skin and the heat source. Leave the heat on for 20-30 minutes. Remove the heat if your skin turns bright red. This is especially important if you are unable to feel pain, heat, or cold. You  have a greater risk of getting burned. General instructions Do exercises as told by your health care provider. Support your toe joint with proper footwear, shoe padding, or taping as told by your health care provider. Take over-the-counter and prescription medicines only as told by your health care provider. Do not use any products that contain nicotine or tobacco, such as cigarettes, e-cigarettes, and chewing tobacco. If you need help quitting, ask your health care provider. Keep all  follow-up visits. This is important. Contact a health care provider if: Your symptoms get worse. Your symptoms do not improve in 2 weeks. Get help right away if: You have severe pain and trouble with walking. Summary A bunion is a bump on the inner side of the big toe joint that forms when the big toe turns toward the second toe. Bunions can make walking painful. Treatment depends on the severity of your symptoms. Support your toe joint with proper footwear, shoe padding, or taping as told by your health care provider. This information is not intended to replace advice given to you by your health care provider. Make sure you discuss any questions you have with your health care provider. Document Revised: 07/23/2019 Document Reviewed: 07/23/2019 Elsevier Patient Education  2022 [employer]. Ankle Sprain, Phase I Rehab An ankle sprain is an injury to the ligaments of your ankle. Ankle sprains cause stiffness, loss of motion, and loss of strength. Ask your health care provider which exercises are safe for you. Do exercises exactly as told by your health care provider and adjust them as directed. It is normal to feel mild stretching, pulling, tightness, or discomfort as you do these exercises. Stop right away if you feel sudden pain or your pain gets worse. Do not begin these exercises until told by your health care provider. Stretching and range-of-motion exercises These exercises warm up your muscles and joints and improve the movement and flexibility of your lower leg and ankle. These exercises also help to relieve pain and stiffness. Gastroc and soleus stretch This exercise is also called a calf stretch. It stretches the muscles in the back of the lower leg. These muscles are the gastrocnemius, or gastroc, and the soleus. Sit on the floor with your left / right leg extended. Loop a belt or towel around the ball of your left / right foot. The ball of your foot is on the walking surface, right  under your toes. Keep your left / right ankle and foot relaxed and keep your knee straight while you use the belt or towel to pull your foot toward you. You should feel a gentle stretch behind your calf or knee in your gastroc muscle. Hold this position for __________ seconds, then release to the starting position. Repeat the exercise with your knee bent. You can put a pillow or a rolled bath towel under your knee to support it. You should feel a stretch deep in your calf in the soleus muscle or at your Achilles tendon. Repeat __________ times. Complete this exercise __________ times a day. Ankle alphabet  Sit with your left / right leg supported at the lower leg. Do not rest your foot on anything. Make sure your foot has room to move freely. Think of your left / right foot as a paintbrush. Move your foot to trace each letter of the alphabet in the air. Keep your hip and knee still while you trace. Make the letters as large as you can without feeling discomfort. Trace every letter from A to  Z. Repeat __________ times. Complete this exercise __________ times a day. Strengthening exercises These exercises build strength and endurance in your ankle and lower leg. Endurance is the ability to use your muscles for a long time, even after they get tired. Ankle dorsiflexion  Secure a rubber exercise band or tube to an object, such as a table leg, that will stay still when the band is pulled. Secure the other end around your left / right foot. Sit on the floor facing the object, with your left / right leg extended. The band or tube should be slightly tense when your foot is relaxed. Slowly bring your foot toward you, bringing the top of your foot toward your shin (dorsiflexion), and pulling the band tighter. Hold this position for __________ seconds. Slowly return your foot to the starting position. Repeat __________ times. Complete this exercise __________ times a day. Ankle plantar flexion  Sit  on the floor with your left / right leg extended. Loop a rubber exercise tube or band around the ball of your left / right foot. The ball of your foot is on the walking surface, right under your toes. Hold the ends of the band or tube in your hands. The band or tube should be slightly tense when your foot is relaxed. Slowly point your foot and toes downward to tilt the top of your foot away from your shin (plantar flexion). Hold this position for __________ seconds. Slowly return your foot to the starting position. Repeat __________ times. Complete this exercise __________ times a day. Ankle eversion Sit on the floor with your legs straight out in front of you. Loop a rubber exercise band or tube around the ball of your left / right foot. The ball of your foot is on the walking surface, right under your toes. Hold the ends of the band in your hands, or secure the band to a stable object. The band or tube should be slightly tense when your foot is relaxed. Slowly push your foot outward, away from your other leg (eversion). Hold this position for __________ seconds. Slowly return your foot to the starting position. Repeat __________ times. Complete this exercise __________ times a day. This information is not intended to replace advice given to you by your health care provider. Make sure you discuss any questions you have with your health care provider. Document Revised: 05/11/2020 Document Reviewed: 05/11/2020 Elsevier Patient Education  2022 [employer].

## 2021-01-28 NOTE — Progress Notes (Signed)
Subjective:   Patient ID: Monique [name], female   DOB: 30 y.o.   MRN: [id number]   HPI 30 year old female presents the office today for concerns of painful bunions with the right side worse than left as well as pain in the callus on the ball of her right foot.  She does have a history of a wound, abscess on the callus submetatarsal 4 on her right foot.  Ultimately surgical intervention can be done to help with this as well as her bunion.  Also due to get similar symptoms on the left big toe and she feels that it Is going to break.  No recent injury that she reports.  She has no other concerns.   Review of Systems  All other systems reviewed and are negative.  Past Medical History:  Diagnosis Date   Allergy    Anemia     No past surgical history on file.   Current Outpatient Medications:    ibuprofen (ADVIL,MOTRIN) 800 MG tablet, Take 1 tablet (800 mg total) by mouth every 8 (eight) hours as needed for moderate pain or cramping., Disp: 21 tablet, Rfl: 0  No Known Allergies        Objective:  Physical Exam  General: AAO x3, NAD  Dermatological: Thickened hyperkeratotic tissue present right foot submetatarsal 4 without any underlying ulceration drainage or any signs of infection.  There is no open lesions.  Mild erythema on the bunion sites there is no skin breakdown or warmth and this is likely more from inflammation as opposed to infection.  Vascular: Dorsalis Pedis artery and Posterior Tibial artery pedal pulses are 2/4 bilateral with immedate capillary fill time. There is no pain with calf compression, swelling, warmth, erythema.   Neruologic: Grossly intact via light touch bilateral.   Musculoskeletal: Moderate bunions are present.  Tenderness palpation on the medial first metatarsal head on the right foot worse than left.  No pain or crepitation with first MPJ range of motion.  Tenderness on the hyperkeratotic lesion submetatarsal 4 right foot.  She is also starting get  discomfort into the right ankle.  No area pinpoint tenderness but most of this on the course of the flexor tendons.  No edema.  MMT/5.  Gait: Unassisted, Nonantalgic.       Assessment:   Right foot bunion, history of abscess right foot; tendinitis ankle; left foot bunion      Plan:  -Treatment options discussed including all alternatives, risks, and complications -Etiology of symptoms were discussed -X-rays were obtained and reviewed with the patient.  Moderate bunions are present bilaterally.  No evidence of acute fracture.  No evidence of osteomyelitis. -We discussed both conservative as well as surgical options for the foot.  I discussed with her bunionectomy however given her history of abscess, infection of the right foot I will get an MRI to rule out any further underlying infection as well as any other osseous deformity underneath the area of the callus, history of the abscess.  Also will check an MRI of her ankle given the ankle discomfort, and rule out tendon tear. -For now continue offloading, shoe modifications. NSAIDs prn. Offloading pads for bunions/callus.   Trula Slade DPM

## 2021-02-07 ENCOUNTER — Telehealth: Payer: Self-pay | Admitting: Podiatry

## 2021-02-07 ENCOUNTER — Telehealth: Payer: Self-pay | Admitting: *Deleted

## 2021-02-07 ENCOUNTER — Other Ambulatory Visit: Payer: Self-pay | Admitting: Podiatry

## 2021-02-07 MED ORDER — MELOXICAM 15 MG PO TABS: 15.0000 mg | ORAL_TABLET | Freq: Every day | ORAL | 0 refills | Status: DC | PRN

## 2021-02-07 NOTE — Telephone Encounter (Signed)
Patient called to follow up on her last visit, she states that she was suppose to have a MRI ordered for her right foot, but has not heard from anyone to schedule the appointment?  She also states that she was suppose to have a antiinflammatory medication called into the pharmacy and she has not received that ?  She was also looking for the exercises she was told to do, but did not get any instructions on? Please advise.

## 2021-02-07 NOTE — Telephone Encounter (Signed)
Called and spoke with [name] today and they stated they have tried to call patient on 01-28-2021 and 02-06-2021 and left messages. Monique [name]

## 2021-02-07 NOTE — Telephone Encounter (Signed)
-----   Message from Trula Slade, DPM sent at 01/28/2021  3:46 PM EDT ----- I ordered a MRI of the right foot and ankle if someone can please follow up on this. Thank you.

## 2021-03-04 ENCOUNTER — Ambulatory Visit
Admission: RE | Admit: 2021-03-04 | Discharge: 2021-03-04 | Disposition: A | Discharge status: Home / Self Care | Payer: Self-pay | Source: Ambulatory Visit | Attending: Podiatry | Admitting: Podiatry

## 2021-03-04 ENCOUNTER — Other Ambulatory Visit: Payer: Self-pay

## 2021-03-04 DIAGNOSIS — M216X1 Other acquired deformities of right foot: Secondary | ICD-10-CM

## 2021-03-04 DIAGNOSIS — M7751 Other enthesopathy of right foot: Secondary | ICD-10-CM

## 2021-03-12 ENCOUNTER — Telehealth: Payer: Self-pay | Admitting: Podiatry

## 2021-03-12 ENCOUNTER — Other Ambulatory Visit: Payer: Self-pay | Admitting: Podiatry

## 2021-03-12 MED ORDER — DICLOFENAC SODIUM 1 % EX GEL: 2.0000 g | Freq: Four times a day (QID) | CUTANEOUS | 2 refills | Status: AC

## 2021-03-12 NOTE — Telephone Encounter (Signed)
Patient called states she can barely walk and the anti-inflammatory medication  you called in did not help her and it upset her stomach is there something else she can take for the pain and swelling? Please advise   Pharmacy :  [facility] in [place]

## 2021-03-13 NOTE — Telephone Encounter (Signed)
Patient has been notified, verbalized understanding.

## 2021-03-23 ENCOUNTER — Other Ambulatory Visit: Payer: Self-pay

## 2021-03-23 ENCOUNTER — Ambulatory Visit: Payer: Medicaid Other | Admitting: Podiatry

## 2021-03-23 DIAGNOSIS — M21611 Bunion of right foot: Secondary | ICD-10-CM | POA: Diagnosis not present

## 2021-03-23 DIAGNOSIS — M7751 Other enthesopathy of right foot: Secondary | ICD-10-CM | POA: Diagnosis not present

## 2021-03-23 NOTE — Patient Instructions (Signed)

## 2021-03-23 NOTE — Progress Notes (Signed)
Subjective: 30 year old female presents the office today for surgical consultation of the ongoing pain in her right foot.  She also presents to discuss MRI results.  She denies any recent injury or trauma   She is also quite a bit of discomfort on the bunion site also.  Previous callus where she had an infection that years ago.  She states that she has been out of work since she had her son and she wants to back to work but her foot has been hurting she is not able to do so.  She has tried offloading, shoe modifications.  Voltaren gel does help some but last for short time.  Objective: AAO x3, NAD DP/PT pulses palpable bilaterally, CRT less than 3 seconds Hyperkeratotic tissue present right foot metatarsal foot without any ulceration, signs of abscess. Erythema, ascending cellulitis.  No fluctuation. Moderate bunion present.  This to palpation along medial first metatarsal head right foot worse than left.  There is no pain or crepitation or restriction with first MPJ range of motion.  Mild erythema to the medial first metatarsal head from irritation side shoes. Mild discomfort in the ankle.  No area pinpoint tenderness. No pain with calf compression, swelling, warmth, erythema  Assessment: 30 year old female symptomatic bunion right foot, skin lesion right foot, tendinitis ankle  Plan: -All treatment options discussed with the patient including all alternatives, risks, complications.  -I reviewed the MRI with her.  Also reviewed her prior x-rays.  We discussed both conservative as well as surgical treatment options.  She is tried shoe modifications, offloading and padding and anti-inflammatories without significant improvement she was proceed with surgical intervention. -Discussed with her right foot bunionectomy including first metatarsal osteotomy with plate, screw fixation, possible Akin osteotomy with condylectomy of the fourth metatarsal and excision of the skin lesion. -In regards to the  tendinitis to the ankle I think this will be resolved with the immobilization also physical therapy postoperatively. -The incision placement as well as the postoperative course was discussed with the patient. I discussed risks of the surgery which include, but not limited to, infection, bleeding, pain, swelling, need for further surgery, delayed or nonhealing, painful or ugly scar, numbness or sensation changes, over/under correction, recurrence, transfer lesions, further deformity, hardware failure, DVT/PE, loss of toe/foot. Patient understands these risks and wishes to proceed with surgery. The surgical consent was reviewed with the patient all 3 pages were signed. No promises or guarantees were given to the outcome of the procedure. All questions were answered to the best of my ability. Before the surgery the patient was encouraged to call the office if there is any further questions. The surgery will be performed at [facility] on an outpatient basis. -Patient encouraged to call the office with any questions, concerns, change in symptoms.   Trula Slade DPM

## 2021-03-23 NOTE — H&P (View-Only) (Signed)
Subjective: 30 year old female presents the office today for surgical consultation of the ongoing pain in her right foot.  She also presents to discuss MRI results.  She denies any recent injury or trauma   She is also quite a bit of discomfort on the bunion site also.  Previous callus where she had an infection that years ago.  She states that she has been out of work since she had her son and she wants to back to work but her foot has been hurting she is not able to do so.  She has tried offloading, shoe modifications.  Voltaren gel does help some but last for short time.  Objective: AAO x3, NAD DP/PT pulses palpable bilaterally, CRT less than 3 seconds Hyperkeratotic tissue present right foot metatarsal foot without any ulceration, signs of abscess. Erythema, ascending cellulitis.  No fluctuation. Moderate bunion present.  This to palpation along medial first metatarsal head right foot worse than left.  There is no pain or crepitation or restriction with first MPJ range of motion.  Mild erythema to the medial first metatarsal head from irritation side shoes. Mild discomfort in the ankle.  No area pinpoint tenderness. No pain with calf compression, swelling, warmth, erythema  Assessment: 30 year old female symptomatic bunion right foot, skin lesion right foot, tendinitis ankle  Plan: -All treatment options discussed with the patient including all alternatives, risks, complications.  -I reviewed the MRI with her.  Also reviewed her prior x-rays.  We discussed both conservative as well as surgical treatment options.  She is tried shoe modifications, offloading and padding and anti-inflammatories without significant improvement she was proceed with surgical intervention. -Discussed with her right foot bunionectomy including first metatarsal osteotomy with plate, screw fixation, possible Akin osteotomy with condylectomy of the fourth metatarsal and excision of the skin lesion. -In regards to the  tendinitis to the ankle I think this will be resolved with the immobilization also physical therapy postoperatively. -The incision placement as well as the postoperative course was discussed with the patient. I discussed risks of the surgery which include, but not limited to, infection, bleeding, pain, swelling, need for further surgery, delayed or nonhealing, painful or ugly scar, numbness or sensation changes, over/under correction, recurrence, transfer lesions, further deformity, hardware failure, DVT/PE, loss of toe/foot. Patient understands these risks and wishes to proceed with surgery. The surgical consent was reviewed with the patient all 3 pages were signed. No promises or guarantees were given to the outcome of the procedure. All questions were answered to the best of my ability. Before the surgery the patient was encouraged to call the office if there is any further questions. The surgery will be performed at [facility] on an outpatient basis. -Patient encouraged to call the office with any questions, concerns, change in symptoms.   Trula Slade DPM

## 2021-04-12 ENCOUNTER — Encounter (HOSPITAL_BASED_OUTPATIENT_CLINIC_OR_DEPARTMENT_OTHER): Payer: Self-pay | Admitting: Podiatry

## 2021-04-12 ENCOUNTER — Other Ambulatory Visit: Payer: Self-pay

## 2021-04-12 NOTE — Progress Notes (Addendum)
ADDENDUM:  Received pt's pcp H&P by Blenda Nicely NP dated 04-12-2021 via fax from Dr Jacqualyn Posey office, placed in chart.   Spoke w/ via phone for pre-op interview--- pt Lab needs dos----  urine preg             Lab results------ no COVID test -----patient states asymptomatic no test needed Arrive at ------- 1045 on 04-18-2021 NPO after MN NO Solid Food.  Clear liquids from MN until--- 0945 Med rec completed Medications to take morning of surgery ----- none Diabetic medication ----- n/a Patient instructed no nail polish to be worn day of surgery Patient instructed to bring photo id and insurance card day of surgery Patient aware to have Driver (ride ) / caregiver for 24 hours after surgery --boyfriend, Public librarian Patient Special Instructions ----- n/a Pre-Op special Istructions ----- have to received pt's pcp H&P from Dr Jacqualyn Posey office yet.  Sent inbox message to dr Jacqualyn Posey requested orders. Patient verbalized understanding of instructions that were given at this phone interview. Patient denies shortness of breath, chest pain, fever, cough at this phone interview.

## 2021-04-16 ENCOUNTER — Other Ambulatory Visit: Payer: Self-pay | Admitting: Podiatry

## 2021-04-16 ENCOUNTER — Telehealth: Payer: Self-pay | Admitting: Urology

## 2021-04-16 ENCOUNTER — Encounter: Payer: Self-pay | Admitting: Podiatry

## 2021-04-16 MED ORDER — CEPHALEXIN 500 MG PO CAPS: 500.0000 mg | ORAL_CAPSULE | Freq: Three times a day (TID) | ORAL | 0 refills | Status: DC

## 2021-04-16 MED ORDER — PROMETHAZINE HCL 25 MG PO TABS: 25.0000 mg | ORAL_TABLET | Freq: Three times a day (TID) | ORAL | 0 refills | Status: AC | PRN

## 2021-04-16 MED ORDER — OXYCODONE-ACETAMINOPHEN 5-325 MG PO TABS: 1.0000 | ORAL_TABLET | Freq: Four times a day (QID) | ORAL | 0 refills | Status: DC | PRN

## 2021-04-16 NOTE — Telephone Encounter (Signed)
DOS - 04/18/21  DOUBLE OSTEOTOMY RIGHT --- 95974 EXC BENIGN LESION RIGHT --- 11421 CONDYLECTOMY RIGHT --- 71855  [facility] EFFECTIVE DATE - 12/30/20   DR Jacqualyn Posey DID A PEER TO PEER WITH DR. Valrie Hart WITH [facility] AND HE APPROVED CPT CODES 01586, [place] AND 82574, [place] # [phone], GOOD FROM 04/18/21 - 06/16/21.

## 2021-04-17 ENCOUNTER — Other Ambulatory Visit: Payer: Self-pay | Admitting: Podiatry

## 2021-04-17 ENCOUNTER — Telehealth: Payer: Self-pay | Admitting: Podiatry

## 2021-04-17 MED ORDER — OXYCODONE-ACETAMINOPHEN 5-325 MG PO TABS: 1.0000 | ORAL_TABLET | Freq: Three times a day (TID) | ORAL | 0 refills | Status: DC | PRN

## 2021-04-17 NOTE — Telephone Encounter (Signed)
Huong, with [facility] called about Rx sent for Oxycodone [id number]. States current Rx exceeds 50 MME as patient has never had an Opioid Rx from their pharmacy. Wants to know if a max per day could be put on Rx so pt could pick up today or tomorrow.

## 2021-04-18 ENCOUNTER — Ambulatory Visit (HOSPITAL_BASED_OUTPATIENT_CLINIC_OR_DEPARTMENT_OTHER): Payer: Medicaid Other | Admitting: Anesthesiology

## 2021-04-18 ENCOUNTER — Encounter (HOSPITAL_BASED_OUTPATIENT_CLINIC_OR_DEPARTMENT_OTHER)
Admission: RE | Disposition: A | Discharge status: Home / Self Care | Payer: Self-pay | Source: Home / Self Care | Attending: Podiatry

## 2021-04-18 ENCOUNTER — Ambulatory Visit (HOSPITAL_BASED_OUTPATIENT_CLINIC_OR_DEPARTMENT_OTHER): Payer: Medicaid Other

## 2021-04-18 ENCOUNTER — Encounter: Payer: Self-pay | Admitting: Podiatry

## 2021-04-18 ENCOUNTER — Encounter (HOSPITAL_BASED_OUTPATIENT_CLINIC_OR_DEPARTMENT_OTHER): Payer: Self-pay | Admitting: Podiatry

## 2021-04-18 ENCOUNTER — Ambulatory Visit (HOSPITAL_BASED_OUTPATIENT_CLINIC_OR_DEPARTMENT_OTHER)
Admission: RE | Admit: 2021-04-18 | Discharge: 2021-04-18 | Disposition: A | Discharge status: Home / Self Care | Payer: Medicaid Other | Attending: Podiatry | Admitting: Podiatry

## 2021-04-18 DIAGNOSIS — M7751 Other enthesopathy of right foot: Secondary | ICD-10-CM | POA: Diagnosis not present

## 2021-04-18 DIAGNOSIS — M21611 Bunion of right foot: Secondary | ICD-10-CM | POA: Insufficient documentation

## 2021-04-18 DIAGNOSIS — D492 Neoplasm of unspecified behavior of bone, soft tissue, and skin: Secondary | ICD-10-CM | POA: Diagnosis not present

## 2021-04-18 DIAGNOSIS — L989 Disorder of the skin and subcutaneous tissue, unspecified: Secondary | ICD-10-CM | POA: Diagnosis not present

## 2021-04-18 DIAGNOSIS — M2011 Hallux valgus (acquired), right foot: Secondary | ICD-10-CM | POA: Diagnosis not present

## 2021-04-18 DIAGNOSIS — M2041 Other hammer toe(s) (acquired), right foot: Secondary | ICD-10-CM | POA: Diagnosis not present

## 2021-04-18 HISTORY — PX: LESION REMOVAL: SHX5196

## 2021-04-18 HISTORY — PX: AIKEN OSTEOTOMY: SHX6331

## 2021-04-18 HISTORY — DX: Gastro-esophageal reflux disease without esophagitis: K21.9

## 2021-04-18 HISTORY — DX: Bunion of right foot: M21.611

## 2021-04-18 HISTORY — PX: METATARSAL HEAD EXCISION: SHX5027

## 2021-04-18 HISTORY — DX: Unspecified osteoarthritis, unspecified site: M19.90

## 2021-04-18 LAB — POCT PREGNANCY, URINE: Preg Test, Ur: NEGATIVE

## 2021-04-18 SURGERY — BUNIONECTOMY, AKIN
Anesthesia: General | Site: Foot | Laterality: Right

## 2021-04-18 MED ORDER — FENTANYL CITRATE (PF) 100 MCG/2ML IJ SOLN: 25.0000 ug | INTRAMUSCULAR | Status: DC | PRN

## 2021-04-18 MED ORDER — ONDANSETRON HCL 4 MG/2ML IJ SOLN
INTRAMUSCULAR | Status: DC | PRN
  Administered 2021-04-18: 4 mg via INTRAVENOUS

## 2021-04-18 MED ORDER — MIDAZOLAM HCL 2 MG/2ML IJ SOLN
INTRAMUSCULAR | Status: DC | PRN
Start: 2021-04-18 — End: 2021-04-18
  Administered 2021-04-18: 2 mg via INTRAVENOUS

## 2021-04-18 MED ORDER — PROPOFOL 500 MG/50ML IV EMUL
INTRAVENOUS | Status: DC | PRN
  Administered 2021-04-18: 125 ug/kg/min via INTRAVENOUS

## 2021-04-18 MED ORDER — FENTANYL CITRATE (PF) 100 MCG/2ML IJ SOLN
INTRAMUSCULAR | Status: AC
  Filled 2021-04-18: qty 2

## 2021-04-18 MED ORDER — PROMETHAZINE HCL 25 MG/ML IJ SOLN: 6.2500 mg | INTRAMUSCULAR | Status: DC | PRN

## 2021-04-18 MED ORDER — BUPIVACAINE-EPINEPHRINE (PF) 0.5% -1:200000 IJ SOLN
INTRAMUSCULAR | Status: DC | PRN
  Administered 2021-04-18: 30 mL via PERINEURAL

## 2021-04-18 MED ORDER — MIDAZOLAM HCL 2 MG/2ML IJ SOLN: 1.0000 mg | INTRAMUSCULAR | Status: DC | PRN

## 2021-04-18 MED ORDER — DEXMEDETOMIDINE (PRECEDEX) IN NS 20 MCG/5ML (4 MCG/ML) IV SYRINGE
PREFILLED_SYRINGE | INTRAVENOUS | Status: DC | PRN
  Administered 2021-04-18: 12 ug via INTRAVENOUS

## 2021-04-18 MED ORDER — CEFAZOLIN SODIUM-DEXTROSE 2-4 GM/100ML-% IV SOLN
INTRAVENOUS | Status: AC
  Filled 2021-04-18: qty 100

## 2021-04-18 MED ORDER — LACTATED RINGERS IV SOLN: INTRAVENOUS | Status: DC

## 2021-04-18 MED ORDER — CHLORHEXIDINE GLUCONATE CLOTH 2 % EX PADS: 6.0000 | MEDICATED_PAD | Freq: Once | CUTANEOUS | Status: DC

## 2021-04-18 MED ORDER — FENTANYL CITRATE (PF) 100 MCG/2ML IJ SOLN
INTRAMUSCULAR | Status: DC | PRN
Start: 2021-04-18 — End: 2021-04-18
  Administered 2021-04-18 (×2): 50 ug via INTRAVENOUS
  Administered 2021-04-18: 100 ug via INTRAVENOUS

## 2021-04-18 MED ORDER — CEFAZOLIN SODIUM-DEXTROSE 2-4 GM/100ML-% IV SOLN
2.0000 g | INTRAVENOUS | Status: AC
  Administered 2021-04-18: 2 g via INTRAVENOUS

## 2021-04-18 MED ORDER — OXYCODONE HCL 5 MG PO TABS: 5.0000 mg | ORAL_TABLET | Freq: Once | ORAL | Status: DC | PRN

## 2021-04-18 MED ORDER — MIDAZOLAM HCL 2 MG/2ML IJ SOLN
INTRAMUSCULAR | Status: AC
  Filled 2021-04-18: qty 2

## 2021-04-18 MED ORDER — PROPOFOL 10 MG/ML IV BOLUS
INTRAVENOUS | Status: AC
  Filled 2021-04-18: qty 20

## 2021-04-18 MED ORDER — AMISULPRIDE (ANTIEMETIC) 5 MG/2ML IV SOLN: 10.0000 mg | Freq: Once | INTRAVENOUS | Status: DC | PRN

## 2021-04-18 MED ORDER — OXYCODONE HCL 5 MG/5ML PO SOLN: 5.0000 mg | Freq: Once | ORAL | Status: DC | PRN

## 2021-04-18 MED ORDER — KETOROLAC TROMETHAMINE 30 MG/ML IJ SOLN: 30.0000 mg | Freq: Once | INTRAMUSCULAR | Status: DC | PRN

## 2021-04-18 MED ORDER — ACETAMINOPHEN 500 MG PO TABS
ORAL_TABLET | ORAL | Status: AC
  Filled 2021-04-18: qty 2

## 2021-04-18 MED ORDER — ACETAMINOPHEN 500 MG PO TABS
1000.0000 mg | ORAL_TABLET | Freq: Once | ORAL | Status: AC
  Administered 2021-04-18: 1000 mg via ORAL

## 2021-04-18 MED ORDER — PROPOFOL 10 MG/ML IV BOLUS
INTRAVENOUS | Status: DC | PRN
Start: 2021-04-18 — End: 2021-04-18
  Administered 2021-04-18: 30 mg via INTRAVENOUS

## 2021-04-18 MED ORDER — DEXMEDETOMIDINE (PRECEDEX) IN NS 20 MCG/5ML (4 MCG/ML) IV SYRINGE
PREFILLED_SYRINGE | INTRAVENOUS | Status: AC
  Filled 2021-04-18: qty 5

## 2021-04-18 SURGICAL SUPPLY — 108 items
APL PRP STRL LF DISP 70% ISPRP (MISCELLANEOUS) ×1
BAG DECANTER FOR FLEXI CONT (MISCELLANEOUS) ×2 IMPLANT
BAG SPEC THK2 15X12 ZIP CLS (MISCELLANEOUS) ×1
BAG ZIPLOCK 12X15 (MISCELLANEOUS) ×2 IMPLANT
BLADE AVERAGE 25X9 (BLADE) ×3 IMPLANT
BLADE OSC/SAG .038X5.5 CUT EDG (BLADE) ×1 IMPLANT
BLADE SURG 10 STRL SS (BLADE) ×4 IMPLANT
BLADE SURG 15 STRL LF DISP TIS (BLADE) ×2 IMPLANT
BLADE SURG 15 STRL SS (BLADE) ×4
BNDG CMPR 9X4 STRL LF SNTH (GAUZE/BANDAGES/DRESSINGS) ×1
BNDG CONFORM 2 STRL LF (GAUZE/BANDAGES/DRESSINGS) ×2 IMPLANT
BNDG ELASTIC 3X5.8 VLCR STR LF (GAUZE/BANDAGES/DRESSINGS) ×2 IMPLANT
BNDG ELASTIC 4X5.8 VLCR STR LF (GAUZE/BANDAGES/DRESSINGS) ×2 IMPLANT
BNDG ELASTIC 6X5.8 VLCR STR LF (GAUZE/BANDAGES/DRESSINGS) IMPLANT
BNDG ESMARK 4X9 LF (GAUZE/BANDAGES/DRESSINGS) ×2 IMPLANT
BNDG GAUZE ELAST 4 BULKY (GAUZE/BANDAGES/DRESSINGS) ×2 IMPLANT
CHLORAPREP W/TINT 26 (MISCELLANEOUS) ×2 IMPLANT
COVER BACK TABLE 60X90IN (DRAPES) ×2 IMPLANT
COVER MAYO STAND STRL (DRAPES) ×2 IMPLANT
COVER SURGICAL LIGHT HANDLE (MISCELLANEOUS) ×2 IMPLANT
CUFF TOURN SGL QUICK 18X4 (TOURNIQUET CUFF) ×2 IMPLANT
CUFF TOURN SGL QUICK 24 (TOURNIQUET CUFF)
CUFF TRNQT CYL 24X4X16.5-23 (TOURNIQUET CUFF) IMPLANT
DECANTER SPIKE VIAL GLASS SM (MISCELLANEOUS) ×2 IMPLANT
DRAPE 3/4 80X56 (DRAPES) ×2 IMPLANT
DRAPE C-ARM 35X43 STRL (DRAPES) IMPLANT
DRAPE C-ARMOR (DRAPES) IMPLANT
DRAPE EXTREMITY T 121X128X90 (DISPOSABLE) ×2 IMPLANT
DRAPE IMP U-DRAPE 54X76 (DRAPES) ×2 IMPLANT
DRAPE SURG 17X23 STRL (DRAPES) ×2 IMPLANT
DRAPE U-SHAPE 47X51 STRL (DRAPES) ×2 IMPLANT
DRSG ADAPTIC 3X8 NADH LF (GAUZE/BANDAGES/DRESSINGS) IMPLANT
DRSG EMULSION OIL 3X3 NADH (GAUZE/BANDAGES/DRESSINGS) ×2 IMPLANT
DRSG PAD ABDOMINAL 8X10 ST (GAUZE/BANDAGES/DRESSINGS) ×2 IMPLANT
DURAPREP 26ML APPLICATOR (WOUND CARE) ×2 IMPLANT
ELECT REM PT RETURN 9FT ADLT (ELECTROSURGICAL) ×2
ELECTRODE REM PT RTRN 9FT ADLT (ELECTROSURGICAL) ×1 IMPLANT
GAUZE 4X4 16PLY ~~LOC~~+RFID DBL (SPONGE) ×2 IMPLANT
GAUZE PACKING IODOFORM 1/4X15 (PACKING) IMPLANT
GAUZE SPONGE 4X4 12PLY STRL (GAUZE/BANDAGES/DRESSINGS) ×2 IMPLANT
GAUZE SPONGE 4X4 12PLY STRL LF (GAUZE/BANDAGES/DRESSINGS) ×1 IMPLANT
GAUZE XEROFORM 1X8 LF (GAUZE/BANDAGES/DRESSINGS) ×2 IMPLANT
GLOVE SRG 8 PF TXTR STRL LF DI (GLOVE) ×1 IMPLANT
GLOVE SURG ENC MOIS LTX SZ7.5 (GLOVE) ×4 IMPLANT
GLOVE SURG UNDER POLY LF SZ8 (GLOVE) ×2
GOWN STRL REUS W/TWL LRG LVL3 (GOWN DISPOSABLE) ×2 IMPLANT
GOWN STRL REUS W/TWL XL LVL3 (GOWN DISPOSABLE) ×2 IMPLANT
HANDLE RATCHET STRL (INSTRUMENTS) ×1 IMPLANT
K-WIRE SURGICAL 1.6X102 (WIRE) IMPLANT
KIT DRILL MINI BUNION (DRILL) ×1 IMPLANT
KIT INSTRUMENT MINI BUNION (INSTRUMENTS) ×1 IMPLANT
KIT TURNOVER CYSTO (KITS) ×2 IMPLANT
MANIFOLD NEPTUNE II (INSTRUMENTS) ×2 IMPLANT
NDL HYPO 25X1 1.5 SAFETY (NEEDLE) ×1 IMPLANT
NDL SAFETY ECLIPSE 18X1.5 (NEEDLE) IMPLANT
NEEDLE HYPO 18GX1.5 SHARP (NEEDLE)
NEEDLE HYPO 22GX1.5 SAFETY (NEEDLE) IMPLANT
NEEDLE HYPO 25X1 1.5 SAFETY (NEEDLE) ×2 IMPLANT
NS IRRIG 1000ML POUR BTL (IV SOLUTION) ×2 IMPLANT
PACK BASIN DAY SURGERY FS (CUSTOM PROCEDURE TRAY) ×2 IMPLANT
PACK ORTHO EXTREMITY (CUSTOM PROCEDURE TRAY) ×2 IMPLANT
PAD CAST 4YDX4 CTTN HI CHSV (CAST SUPPLIES) IMPLANT
PADDING CAST ABS 4INX4YD NS (CAST SUPPLIES) ×1
PADDING CAST ABS COTTON 4X4 ST (CAST SUPPLIES) ×1 IMPLANT
PADDING CAST COTTON 4X4 STRL (CAST SUPPLIES)
PENCIL SMOKE EVACUATOR (MISCELLANEOUS) ×2 IMPLANT
PIN CAPS ORTHO GREEN .062 (PIN) IMPLANT
PLATE MINI BUNION OFFSET 3.5 (Plate) ×1 IMPLANT
PROTECTOR NERVE ULNAR (MISCELLANEOUS) ×2 IMPLANT
RASP SM TEAR CROSS CUT (RASP) ×1 IMPLANT
SCREW MINI BUNION LOC 3.0X20 (Screw) ×1 IMPLANT
SCREW MINI BUNION NL 2.7X14 (Screw) ×1 IMPLANT
SOL SCRUB PVP POV-IOD 4OZ 7.5% (MISCELLANEOUS) ×2
SOL TOPI POVIDONE IODINE PAINT (MISCELLANEOUS) ×2 IMPLANT
SOLUTION SCRB POV-IOD 4OZ 7.5% (MISCELLANEOUS) ×1 IMPLANT
STAPLE BONE FIXATION 10X10 (Staple) ×1 IMPLANT
STAPLER VISISTAT 35W (STAPLE) IMPLANT
STOCKINETTE 6  STRL (DRAPES) ×1
STOCKINETTE 6 STRL (DRAPES) ×1 IMPLANT
STRIP CLOSURE SKIN 1/2X4 (GAUZE/BANDAGES/DRESSINGS) IMPLANT
SUCTION FRAZIER HANDLE 10FR (MISCELLANEOUS) ×1
SUCTION TUBE FRAZIER 10FR DISP (MISCELLANEOUS) ×1 IMPLANT
SUT ETHILON 4 0 PS 2 18 (SUTURE) ×6 IMPLANT
SUT MERSILENE 2.0 SH NDLE (SUTURE) ×2 IMPLANT
SUT MERSILENE 3 0 FS 1 (SUTURE) IMPLANT
SUT MNCRL AB 3-0 PS2 18 (SUTURE) ×2 IMPLANT
SUT MNCRL AB 4-0 PS2 18 (SUTURE) ×5 IMPLANT
SUT MON AB 2-0 SH 27 (SUTURE)
SUT MON AB 2-0 SH27 (SUTURE) IMPLANT
SUT MON AB 3-0 SH 27 (SUTURE)
SUT MON AB 3-0 SH27 (SUTURE) IMPLANT
SUT MON AB 4-0 PS1 27 (SUTURE) IMPLANT
SUT MON AB 5-0 PS2 18 (SUTURE) ×2 IMPLANT
SUT PROLENE 3 0 PS 2 (SUTURE) ×2 IMPLANT
SUT PROLENE 4 0 PS 2 18 (SUTURE) IMPLANT
SUT VIC AB 2-0 SH 27 (SUTURE) ×2
SUT VIC AB 2-0 SH 27XBRD (SUTURE) ×1 IMPLANT
SWAB COLLECTION DEVICE MRSA (MISCELLANEOUS) IMPLANT
SWAB CULTURE ESWAB REG 1ML (MISCELLANEOUS) IMPLANT
SYR 10ML LL (SYRINGE) IMPLANT
SYR 20ML LL LF (SYRINGE) IMPLANT
SYR BULB EAR ULCER 3OZ GRN STR (SYRINGE) ×2 IMPLANT
SYR CONTROL 10ML LL (SYRINGE) ×2 IMPLANT
TOWEL OR 17X26 10 PK STRL BLUE (TOWEL DISPOSABLE) ×2 IMPLANT
TRAY DSU PREP LF (CUSTOM PROCEDURE TRAY) ×2 IMPLANT
UNDERPAD 30X36 HEAVY ABSORB (UNDERPADS AND DIAPERS) ×2 IMPLANT
WATER STERILE IRR 1000ML POUR (IV SOLUTION) ×2 IMPLANT
YANKAUER SUCT BULB TIP NO VENT (SUCTIONS) ×2 IMPLANT

## 2021-04-18 NOTE — Brief Op Note (Signed)
04/18/2021  3:43 PM  PATIENT:  Monique [name]  31 y.o. female  PRE-OPERATIVE DIAGNOSIS:  BUNION RIGHT FOOT SKIN LESION RIGHT FOOT  POST-OPERATIVE DIAGNOSIS:  BUNION RIGHT FOOT SKIN LESION RIGHT FOOT   PROCEDURE:  Procedure(s) with comments: DOUBLE OSTEOTOMY (Right) EXCISION BENIGN LESION (Right) CONDYLECTOMY (Right) - BLOCK  SURGEON:  Surgeon(s) and Role:    * Trula Slade, DPM - Primary  PHYSICIAN ASSISTANT:   ASSISTANTS: none   ANESTHESIA:   none  EBL:  minimal   BLOOD ADMINISTERED:none  DRAINS: none   LOCAL MEDICATIONS USED:  NONE  SPECIMEN:  No Specimen  DISPOSITION OF SPECIMEN:  N/A  COUNTS:  YES  TOURNIQUET:   Total Tourniquet Time Documented: area (Right) - 77 minutes Total: area (Right) - 77 minutes   DICTATION: .Viviann Spare Dictation  PLAN OF CARE: Discharge to home after PACU  PATIENT DISPOSITION:  PACU - hemodynamically stable.   Delay start of Pharmacological VTE agent (>24hrs) due to surgical blood loss or risk of bleeding: no  Intraoperative findings: Status post bunionectomy with Akin, shaving of the plantar 4the metatarsal and excision of skin lesion. Skin lesion sent to pathology. At the end of the procedure x-rays were obtained again for final views and the Akin staple at broken through plantar at the distal aspect. I re-did the staple to have better purchase with the bone. At the conclusion the hardware was intact and reduction of the deformity noted.

## 2021-04-18 NOTE — Discharge Instructions (Addendum)
See written instructions  Do not put weight on your foot. Wear surgical boot and ice/elevate. You can start the medications that were sent over for post-op use.   Please let me know if you have any questions. Regional Anesthesia Blocks  1. Numbness or the inability to move the "blocked" extremity may last from 3-48 hours after placement. The length of time depends on the medication injected and your individual response to the medication. If the numbness is not going away after 48 hours, call your surgeon.  2. The extremity that is blocked will need to be protected until the numbness is gone and the  Strength has returned. Because you cannot feel it, you will need to take extra care to avoid injury. Because it may be weak, you may have difficulty moving it or using it. You may not know what position it is in without looking at it while the block is in effect.  3. For blocks in the legs and feet, returning to weight bearing and walking needs to be done carefully. You will need to wait until the numbness is entirely gone and the strength has returned. You should be able to move your leg and foot normally before you try and bear weight or walk. You will need someone to be with you when you first try to ensure you do not fall and possibly risk injury.  4. Bruising and tenderness at the needle site are common side effects and will resolve in a few days.  5. Persistent numbness or new problems with movement should be communicated to the surgeon or the [facility] [phone] [place] [phone]).  Post Anesthesia Home Care Instructions  Activity: Get plenty of rest for the remainder of the day. A responsible adult should stay with you for 24 hours following the procedure.  For the next 24 hours, DO NOT: -Drive a car -Paediatric nurse -Drink alcoholic beverages -Take any medication unless instructed by your physician -Make any legal decisions or sign important  papers.  Meals: Start with liquid foods such as gelatin or soup. Progress to regular foods as tolerated. Avoid greasy, spicy, heavy foods. If nausea and/or vomiting occur, drink only clear liquids until the nausea and/or vomiting subsides. Call your physician if vomiting continues.  Special Instructions/Symptoms: Your throat may feel dry or sore from the anesthesia or the breathing tube placed in your throat during surgery. If this causes discomfort, gargle with warm salt water. The discomfort should disappear within 24 hours.

## 2021-04-18 NOTE — Interval H&P Note (Signed)
History and Physical Interval Note:  04/18/2021 1:35 PM  Monique [name]  has presented today for surgery, with the diagnosis of BUNION RIGHT FOOT SKIN LESION RIGHT FOOT. The various methods of treatment have been discussed with the patient and family. After consideration of risks, benefits and other options for treatment, the patient has consented to  Procedure(s) with comments: DOUBLE OSTEOTOMY (Right) EXCISION BENIGN LESION (Right) CONDYLECTOMY (Right) - BLOCK as a surgical intervention.  The patient's history has been reviewed, patient examined, no change in status, stable for surgery.  I have reviewed the patient's chart and labs.  Questions were answered to the patient's satisfaction.     Trula Slade

## 2021-04-18 NOTE — Anesthesia Procedure Notes (Signed)
Anesthesia Regional Block: Popliteal block   Pre-Anesthetic Checklist: , timeout performed,  Correct Patient, Correct Site, Correct Laterality,  Correct Procedure, Correct Position, site marked,  Risks and benefits discussed,  Surgical consent,  Pre-op evaluation,  At surgeon's request and post-op pain management  Laterality: Right  Prep: chloraprep       Needles:  Injection technique: Single-shot  Needle Type: Echogenic Stimulator Needle     Needle Length: 10cm  Needle Gauge: 20     Additional Needles:   Procedures:,,,, ultrasound used (permanent image in chart),,    Narrative:  Start time: 04/18/2021 1:45 PM End time: 04/18/2021 1:55 PM Injection made incrementally with aspirations every 5 mL.  Performed by: Personally  Anesthesiologist: Murvin Natal, MD  Additional Notes: Functioning IV was confirmed and monitors were applied.  A timeout was performed. Sterile prep, hand hygiene and sterile gloves were used. A 171mm 20ga BBraun echogenic stimulator needle was used. Negative aspiration and negative test dose prior to incremental administration of local anesthetic. The patient tolerated the procedure well.  Ultrasound guidance: relevent anatomy identified, needle position confirmed, local anesthetic spread visualized around nerve(s), vascular puncture avoided.  Image printed for medical record.

## 2021-04-18 NOTE — Progress Notes (Signed)
Orthopedic Tech Progress Note Patient Details:  Monique [name] [birth year] [id number]  Patient ID: Monique [name], female   DOB: 1991/01/11, 31 y.o.   MRN: [id number]  Monique [name] 04/18/2021, 4:37 PM Cam walker delivered and applied in [facility]

## 2021-04-18 NOTE — Transfer of Care (Signed)
Immediate Anesthesia Transfer of Care Note  Patient: Monique [name]  Procedure(s) Performed: DOUBLE OSTEOTOMY (Right: Foot) EXCISION BENIGN LESION (Right: Foot) CONDYLECTOMY (Right: Foot)  Patient Location: PACU  Anesthesia Type:MAC combined with regional for post-op pain  Level of Consciousness: awake, alert  and oriented  Airway & Oxygen Therapy: Patient Spontanous Breathing and Patient connected to face mask oxygen  Post-op Assessment: Report given to RN and Post -op Vital signs reviewed and stable  Post vital signs: Reviewed and stable  Last Vitals:  Vitals Value Taken Time  BP 114/72   Temp    Pulse 68 04/18/21 1553  Resp 19 04/18/21 1553  SpO2 98 % 04/18/21 1553  Vitals shown include unvalidated device data.  Last Pain:  Vitals:   04/18/21 1124  TempSrc: Oral  PainSc: 8       Patients Stated Pain Goal: 5 (95/18/84 1660)  Complications: No notable events documented.

## 2021-04-18 NOTE — Anesthesia Preprocedure Evaluation (Addendum)
Anesthesia Evaluation  Patient identified by MRN, date of birth, ID band Patient awake    Reviewed: Allergy & Precautions, NPO status , Patient's Chart, lab work & pertinent test results  Airway Mallampati: I  TM Distance: >3 FB Neck ROM: Full    Dental  (+) Poor Dentition   Pulmonary Current Smoker and Patient abstained from smoking.,    Pulmonary exam normal breath sounds clear to auscultation       Cardiovascular negative cardio ROS Normal cardiovascular exam Rhythm:Regular Rate:Normal     Neuro/Psych negative neurological ROS  negative psych ROS   GI/Hepatic negative GI ROS, Neg liver ROS,   Endo/Other  negative endocrine ROS  Renal/GU negative Renal ROS     Musculoskeletal  (+) Arthritis ,   Abdominal   Peds  Hematology negative hematology ROS (+)   Anesthesia Other Findings BUNION RIGHT FOOT  SKIN LESION RIGHT FOOT TENDINITIS RIGHT ANKLE  Reproductive/Obstetrics hcg negative                             Anesthesia Physical Anesthesia Plan  ASA: 2  Anesthesia Plan: General   Post-op Pain Management:    Induction: Intravenous  PONV Risk Score and Plan: 2 and Ondansetron, Dexamethasone, Midazolam and Treatment may vary due to age or medical condition  Airway Management Planned: LMA  Additional Equipment:   Intra-op Plan:   Post-operative Plan: Extubation in OR  Informed Consent: I have reviewed the patients History and Physical, chart, labs and discussed the procedure including the risks, benefits and alternatives for the proposed anesthesia with the patient or authorized representative who has indicated his/her understanding and acceptance.     Dental advisory given  Plan Discussed with: CRNA and Surgeon  Anesthesia Plan Comments: (Patient prefers general anesthesia)      Anesthesia Quick Evaluation

## 2021-04-19 ENCOUNTER — Encounter: Payer: Self-pay | Admitting: Podiatry

## 2021-04-19 NOTE — Anesthesia Postprocedure Evaluation (Signed)
Anesthesia Post Note  Patient: Monique [name]  Procedure(s) Performed: DOUBLE OSTEOTOMY (Right: Foot) EXCISION BENIGN LESION (Right: Foot) CONDYLECTOMY (Right: Foot)     Patient location during evaluation: PACU Anesthesia Type: Regional and MAC Level of consciousness: awake and alert Pain management: pain level controlled Vital Signs Assessment: post-procedure vital signs reviewed and stable Respiratory status: spontaneous breathing, nonlabored ventilation, respiratory function stable and patient connected to nasal cannula oxygen Cardiovascular status: stable and blood pressure returned to baseline Postop Assessment: no apparent nausea or vomiting Anesthetic complications: no   No notable events documented.  Last Vitals:  Vitals:   04/18/21 1616 04/18/21 1619  BP: 101/65   Pulse: (!) 59 75  Resp: 15 16  Temp: 36.4 C   SpO2: 97% 97%    Last Pain:  Vitals:   04/18/21 1600  TempSrc:   PainSc: 0-No pain   Pain Goal: Patients Stated Pain Goal: 5 (04/18/21 1124)                 Barnet Glasgow

## 2021-04-20 ENCOUNTER — Encounter (HOSPITAL_BASED_OUTPATIENT_CLINIC_OR_DEPARTMENT_OTHER): Payer: Self-pay | Admitting: Podiatry

## 2021-04-20 LAB — SURGICAL PATHOLOGY

## 2021-04-23 ENCOUNTER — Ambulatory Visit (INDEPENDENT_AMBULATORY_CARE_PROVIDER_SITE_OTHER): Payer: Medicaid Other

## 2021-04-23 ENCOUNTER — Other Ambulatory Visit: Payer: Self-pay

## 2021-04-23 ENCOUNTER — Ambulatory Visit: Payer: Medicaid Other | Admitting: Podiatry

## 2021-04-23 DIAGNOSIS — M21611 Bunion of right foot: Secondary | ICD-10-CM

## 2021-04-23 DIAGNOSIS — M216X1 Other acquired deformities of right foot: Secondary | ICD-10-CM

## 2021-04-23 DIAGNOSIS — Z9889 Other specified postprocedural states: Secondary | ICD-10-CM | POA: Diagnosis not present

## 2021-04-23 MED ORDER — OXYCODONE-ACETAMINOPHEN 5-325 MG PO TABS: 1.0000 | ORAL_TABLET | Freq: Three times a day (TID) | ORAL | 0 refills | Status: DC | PRN

## 2021-04-26 ENCOUNTER — Other Ambulatory Visit: Payer: Self-pay | Admitting: Podiatry

## 2021-04-26 ENCOUNTER — Encounter: Payer: Self-pay | Admitting: Podiatry

## 2021-04-26 MED ORDER — OXYCODONE-ACETAMINOPHEN 5-325 MG PO TABS: 1.0000 | ORAL_TABLET | Freq: Three times a day (TID) | ORAL | 0 refills | Status: DC | PRN

## 2021-04-26 NOTE — Telephone Encounter (Signed)
Patient called in to see if we could send refill of pain medication?  Please advise

## 2021-04-27 NOTE — Progress Notes (Signed)
Subjective: Monique [name] is a 31 y.o. is seen today in office s/p right foot bunionectomy, condylectomy of the fourth metatarsal with excision of skin lesion preformed on 04/18/2021.  She states the pain is improving but still taking pain medication.  She has been in the cam boot nonweightbearing.  Denies any systemic complaints such as fevers, chills, nausea, vomiting. No calf pain, chest pain, shortness of breath.   Objective: General: No acute distress, AAOx3  DP/PT pulses palpable 2/4, CRT < 3 sec to all digits.  Protective sensation intact. Motor function intact.  Right foot: Incision is well coapted without any evidence of dehiscence with sutures intact. There is no surrounding erythema, ascending cellulitis, fluctuance, crepitus, malodor, drainage/purulence. There is mild edema around the surgical site. There is mild pain along the surgical site.  No other areas of tenderness to bilateral lower extremities.  No other open lesions or pre-ulcerative lesions.  No pain with calf compression, swelling, warmth, erythema.   Assessment and Plan:  Status post right foot surgery, doing well with no complications   -Treatment options discussed including all alternatives, risks, and complications -X-rays obtained reviewed.  Status post first metatarsal osteotomy with plate, screw fixation.  No evidence of acute fracture. -Incisions were cleaned today.  A small amount of antibiotic ointment was applied followed by dressing.  The dressing clean, dry, intact. -Remain nonweightbearing -Ice/elevation -Pain medication as needed. -Monitor for any clinical signs or symptoms of infection and DVT/PE and directed to call the office immediately should any occur or go to the ER. -Follow-up as scheduled for possible suture removal or sooner if any problems arise. In the meantime, encouraged to call the office with any questions, concerns, change in symptoms.   Celesta Gentile, DPM

## 2021-04-30 NOTE — Op Note (Signed)
PRE-OPERATIVE DIAGNOSIS:  BUNION RIGHT FOOT SKIN LESION RIGHT FOOT   POST-OPERATIVE DIAGNOSIS:  BUNION RIGHT FOOT SKIN LESION RIGHT FOOT     PROCEDURE:  Procedure(s) with comments: DOUBLE OSTEOTOMY (Right) EXCISION BENIGN LESION (Right) CONDYLECTOMY (Right) - BLOCK   SURGEON:  Surgeon(s) and Role:    * Trula Slade, DPM - Primary   PHYSICIAN ASSISTANT:    ASSISTANTS: none    ANESTHESIA:   none   EBL:  minimal    BLOOD ADMINISTERED:none   DRAINS: none    LOCAL MEDICATIONS USED:  NONE   SPECIMEN:  No Specimen   DISPOSITION OF SPECIMEN:  N/A   COUNTS:  YES  Indications for surgery: 31 year old female presents to the office with concerns of painful bunion on her right foot as well as for symptomatic skin lesion of the plantar aspect of foot.  She previously had a callus which got infected.  She had MRI performed and the signs of infection noted.  Given ongoing pain we discussed surgical intervention including bunionectomy, cheilectomy of the fourth metatarsal as well as excision of skin lesion.  We discussed the surgery was postoperative course.  Alternatives, risks, complications were discussed.  No promises or guarantees given second of the procedure.  All questions answered the best my ability.  Procedure in detail: The patient was both verbally and visually identified by myself, nursing staff, the anesthesia staff preoperatively.  Preoperative she had nerve block performed by anesthesia.  She was then transferred to the operative room via stretcher and placed operative table in supine position.  After adequate plane of anesthesia was obtained a well-padded pneumatic calf tourniquet applied making sure to pad all bony prominences.  The right lower extremities and scrubbed, prepped, draped in normal sterile fashion.  Timeout was performed.  At this time I utilized C-arm in order to mark the first metatarsal with bunionectomy.  The the foot was exsanguinated with an  Esmarch bandage and the pneumatic calf tourniquet was inflated to 200 mmHg.  At this time an incision proximally 1.5 cm in length was made along the medial first metatarsal with a 15 blade scalpel the epidermis to the dermis.  Blunt dissection was then carried on the first metatarsal and both the dorsal and plantar aspects of the metatarsal identified.  At this time the cut guide was then placed with temporary fixation with K wires and different under the fluoroscopy.  Once the appropriate position an osteotomy was created utilizing sagittal bone saw.  Cut guide was removed and the osteotomy was finished.  At this time a freer elevator was utilized to approach the first metatarsal.  A [place] plate was inserted into the first metatarsal.  Fluoroscopy was utilized to confirm it was sitting appropriately.  Once in the appropriate position the proximal nonlocking screw was placed but not fully tightened.  The first metatarsal capital fragment was then placed into a corrected position.  Once this was confirmed a distal locking screw was then inserted.  At this time the proximal nonlocking screw was then tightened.  There is clinically found to be adequate correction.  Fluoroscopy utilized for the procedure.  I then utilized a power rasp to smooth down the edge of the first metatarsal.  There was still going to be abductus present and so I elected proceed in [place].  A secondary small incision was made along the proximal phalanx with a 15 by scalpel the epidermis the dermis.  Blunt dissection was then carried to the proximal phalanx.  Soft tissue structures freed.  At this time a sagittal saw was utilized to create the osteotomy for the Akin.  Once this was completed the 10 mm staple was inserted.  At this time fluoroscopy utilized confirm placement.  Incisions were copiously irrigated with saline hemostasis achieved.  Both incisions then closed the deep fascia was Monocryl and skin was then closed with  nylon.  Attention was then directed the dorsal aspect of fourth digit was a linear incision was made along the fourth MPJ.  Incision was made with 15 blade scope of the epidermis and dermis.  At this time a capsular incision was made just adjacent to the extensor tendon.  Medicine was able to identify the fourth metatarsal and I used a hand rasp to the smooth the plantar aspect of the metatarsal head.  This incision was copiously irrigated with saline hemostasis achieved.  The incision was closed in a layered fashion the deep structure with Monocryl followed the subcutaneous tissue Monocryl and skin was then closed with nylon.  Tension with injury to the plantar aspect the foot on the area of a skin lesion.  2 symmetrical incisions are planned in order to ellipse the skin lesion.  The incision was made with a 15 blade scalpel the epidermis the dermis.  I was able to remove a wedge of thick callus tissue.  Is something significantly thickened and this was sent to pathology.  The incision was undermined.  Incision was irrigated with saline hemostasis achieved.  The incision was closed the defect with Monocryl and skin was then closed with nylon.  During final fluoroscopy shots it appeared that the staple for the aching had broken through the plantar cortex distally.  I remove the sutures and the stent was then removed.  Was redrilled and placed in the appropriate position.  Fluoroscopy was utilized to confirm adequate placement.  The incision was copiously irrigated incision was then closed with followed by nylon for skin.    Xeroform was applied followed by dry sterile dressing.  The tourniquet was released and there was found to be an immediate cap refill time.  She was woken from anesthesia and found to tolerate the procedure well and complications.  She is transferred to PACU vital sign stable vascular status intact.

## 2021-05-02 ENCOUNTER — Other Ambulatory Visit: Payer: Self-pay | Admitting: Podiatry

## 2021-05-02 MED ORDER — OXYCODONE-ACETAMINOPHEN 5-325 MG PO TABS: 1.0000 | ORAL_TABLET | Freq: Three times a day (TID) | ORAL | 0 refills | Status: DC | PRN

## 2021-05-03 ENCOUNTER — Ambulatory Visit (INDEPENDENT_AMBULATORY_CARE_PROVIDER_SITE_OTHER): Payer: Medicaid Other | Admitting: Podiatry

## 2021-05-03 ENCOUNTER — Other Ambulatory Visit: Payer: Self-pay

## 2021-05-03 DIAGNOSIS — Z9889 Other specified postprocedural states: Secondary | ICD-10-CM

## 2021-05-03 DIAGNOSIS — M216X1 Other acquired deformities of right foot: Secondary | ICD-10-CM

## 2021-05-03 DIAGNOSIS — M21611 Bunion of right foot: Secondary | ICD-10-CM

## 2021-05-05 ENCOUNTER — Encounter: Payer: Self-pay | Admitting: Podiatry

## 2021-05-05 NOTE — Progress Notes (Addendum)
Subjective: Monique [name] is a 31 y.o. is seen today in office s/p right foot bunionectomy, condylectomy of the fourth metatarsal with excision of skin lesion preformed on 04/18/2021.  She presents today for suture removal.  Overall she states that she has been doing well and the pain is improving but she still getting discomfort mostly at nighttime.  She denies any fevers or chills.  No chest pain, shortness of breath.  She has no other concerns today.   Objective: General: No acute distress, AAOx3  DP/PT pulses palpable 2/4, CRT < 3 sec to all digits.  Protective sensation intact. Motor function intact.  Right foot: Incision is well coapted without any evidence of dehiscence with sutures intact.  There is no surrounding erythema, drainage or pus or any signs of infection.  Mild tenderness palpation at surgical sites.  Toes are in rectus position. No pain with calf compression, swelling, warmth, erythema.   Assessment and Plan:  Status post right foot surgery, doing well with no complications   -Treatment options discussed including all alternatives, risks, and complications -Today I did remove the majority of the sutures.  Did leave some of the sutures intact to the plantar incision as there is still some mild motion across the incision.  Cleansed the incisions and a small mount of antibiotic ointment and a bandage applied.  Discussed with her she can apply a similar bandage and wash the foot with soap and water but do not submerge or soak the foot. -Continue cam boot, use of crutches for now. -Ice and elevation. -Monitor for any clinical signs or symptoms of infection and DVT/PE and directed to call the office immediately should any occur or go to the ER. -Follow-up as scheduled for suture removal or sooner if any problems arise. In the meantime, encouraged to call the office with any questions, concerns, change in symptoms.   Celesta Gentile, DPM

## 2021-05-07 ENCOUNTER — Other Ambulatory Visit: Payer: Self-pay | Admitting: Podiatry

## 2021-05-07 MED ORDER — OXYCODONE-ACETAMINOPHEN 5-325 MG PO TABS: 1.0000 | ORAL_TABLET | Freq: Three times a day (TID) | ORAL | 0 refills | Status: DC | PRN

## 2021-05-07 MED ORDER — CYCLOBENZAPRINE HCL 10 MG PO TABS: 10.0000 mg | ORAL_TABLET | Freq: Three times a day (TID) | ORAL | 0 refills | Status: DC | PRN

## 2021-05-07 NOTE — Progress Notes (Signed)
Flexeril ordered for muscle spasms

## 2021-05-11 ENCOUNTER — Ambulatory Visit (INDEPENDENT_AMBULATORY_CARE_PROVIDER_SITE_OTHER): Payer: Medicaid Other | Admitting: Podiatry

## 2021-05-11 ENCOUNTER — Other Ambulatory Visit: Payer: Self-pay

## 2021-05-11 ENCOUNTER — Ambulatory Visit (INDEPENDENT_AMBULATORY_CARE_PROVIDER_SITE_OTHER): Payer: Medicaid Other

## 2021-05-11 DIAGNOSIS — Z9889 Other specified postprocedural states: Secondary | ICD-10-CM

## 2021-05-11 DIAGNOSIS — M21611 Bunion of right foot: Secondary | ICD-10-CM

## 2021-05-11 MED ORDER — IBUPROFEN 800 MG PO TABS: 800.0000 mg | ORAL_TABLET | Freq: Three times a day (TID) | ORAL | 0 refills | Status: DC | PRN

## 2021-05-12 ENCOUNTER — Other Ambulatory Visit: Payer: Self-pay | Admitting: Podiatry

## 2021-05-13 ENCOUNTER — Other Ambulatory Visit: Payer: Self-pay | Admitting: Podiatry

## 2021-05-14 ENCOUNTER — Other Ambulatory Visit: Payer: Self-pay | Admitting: Podiatry

## 2021-05-14 ENCOUNTER — Encounter: Payer: Self-pay | Admitting: Podiatry

## 2021-05-14 MED ORDER — OXYCODONE-ACETAMINOPHEN 5-325 MG PO TABS: 1.0000 | ORAL_TABLET | Freq: Three times a day (TID) | ORAL | 0 refills | Status: DC | PRN

## 2021-05-14 MED ORDER — CYCLOBENZAPRINE HCL 10 MG PO TABS: 10.0000 mg | ORAL_TABLET | Freq: Three times a day (TID) | ORAL | 0 refills | Status: AC | PRN

## 2021-05-15 NOTE — Progress Notes (Signed)
Subjective: Monique [name] is a 31 y.o. is seen today in office s/p right foot bunionectomy, condylectomy of the fourth metatarsal with excision of skin lesion preformed on 04/18/2021.  She presents to have the remainder of the sutures removed.  She states that she still getting discomfort, mostly at nighttime.  She gets swelling.  She has changed the dressing 2 times a week.  No recent injury or falls.  Denies any fevers, chills, nausea, vomiting.  No chest pain or shortness of breath  Objective: General: No acute distress, AAOx3  DP/PT pulses palpable 2/4, CRT < 3 sec to all digits.  Protective sensation intact. Motor function intact.  Right foot: Incision is well coapted without any evidence of dehiscence with sutures intact to the plantar incision.  After removal of the sutures the incisions are well coapted and healing well.  There is no surrounding erythema, ascending cellulitis but there is mild swelling to the left first MPJ area.  She clinically has noticed a popping sensation.  Upon range of motion of the first MPJ there is discomfort, able to elicit any popping sensation in the osteotomy appears to be stable. No pain with calf compression, swelling, warmth, erythema.   Assessment and Plan:  Status post right foot surgery, doing well with no complications   -Treatment options discussed including all alternatives, risks, and complications -Agree with remainder of the sutures today.  Antibiotic ointment and bandage applied.  Discussed she can wash foot with soap and water and dry thoroughly apply a similar bandage.  She is nonweightbearing for now but discussed that she can transition to partial weightbearing as tolerated.  Continue to ice and elevate as well.  Monitor for any signs or symptoms of infection.  Trula Slade DPM

## 2021-05-17 ENCOUNTER — Encounter: Payer: Medicaid Other | Admitting: Podiatry

## 2021-05-19 ENCOUNTER — Other Ambulatory Visit: Payer: Self-pay | Admitting: Podiatry

## 2021-05-20 ENCOUNTER — Other Ambulatory Visit: Payer: Self-pay | Admitting: Podiatry

## 2021-05-20 ENCOUNTER — Encounter: Payer: Self-pay | Admitting: Podiatry

## 2021-05-21 MED ORDER — OXYCODONE-ACETAMINOPHEN 5-325 MG PO TABS: 1.0000 | ORAL_TABLET | Freq: Three times a day (TID) | ORAL | 0 refills | Status: DC | PRN

## 2021-05-25 ENCOUNTER — Other Ambulatory Visit: Payer: Self-pay

## 2021-05-25 ENCOUNTER — Ambulatory Visit (INDEPENDENT_AMBULATORY_CARE_PROVIDER_SITE_OTHER): Payer: Medicaid Other

## 2021-05-25 ENCOUNTER — Ambulatory Visit (INDEPENDENT_AMBULATORY_CARE_PROVIDER_SITE_OTHER): Payer: Medicaid Other | Admitting: Podiatry

## 2021-05-25 DIAGNOSIS — M21611 Bunion of right foot: Secondary | ICD-10-CM

## 2021-05-25 DIAGNOSIS — Z9889 Other specified postprocedural states: Secondary | ICD-10-CM | POA: Diagnosis not present

## 2021-05-25 MED ORDER — IBUPROFEN 800 MG PO TABS: 800.0000 mg | ORAL_TABLET | Freq: Three times a day (TID) | ORAL | 0 refills | Status: DC | PRN

## 2021-05-25 MED ORDER — TRAMADOL HCL 50 MG PO TABS: 50.0000 mg | ORAL_TABLET | Freq: Three times a day (TID) | ORAL | 0 refills | Status: AC | PRN

## 2021-05-25 MED ORDER — GABAPENTIN 100 MG PO CAPS: 100.0000 mg | ORAL_CAPSULE | Freq: Every day | ORAL | 0 refills | Status: DC

## 2021-05-27 ENCOUNTER — Encounter: Payer: Self-pay | Admitting: Podiatry

## 2021-05-29 ENCOUNTER — Other Ambulatory Visit: Payer: Self-pay | Admitting: Podiatry

## 2021-05-29 MED ORDER — OXYCODONE-ACETAMINOPHEN 5-325 MG PO TABS: 1.0000 | ORAL_TABLET | Freq: Four times a day (QID) | ORAL | 0 refills | Status: DC | PRN

## 2021-06-01 NOTE — Progress Notes (Signed)
Subjective: Monique [name] is a 31 y.o. is seen today in office s/p right foot bunionectomy, condylectomy of the fourth metatarsal with excision of skin lesion preformed on 04/18/2021.  States that she is still having discomfort but she does still skin better than it was last visit.  She still describes a throbbing sensation.  She still the cam boot.  She does put some weight on her foot but she still uses crutches.  No fevers or chills.  She has no other concerns.  No injury.    Objective: General: No acute distress, AAOx3  DP/PT pulses palpable 2/4, CRT < 3 sec to all digits.  Protective sensation intact. Motor function intact.  Right foot: Incision is well coapted without any evidence of dehiscence and scar is formed.  There is mild edema but there is no erythema or warmth.  There is still tenderness palpation most of the bunion surgical site but does appear to be improved compared to last appointment.  Toes in rectus position.  No pain with calf compression, swelling, warmth, erythema.   Assessment and Plan:  Status post right foot surgery, improving  -Treatment options discussed including all alternatives, risks, and complications -X-rays obtained reviewed.  Status post first metatarsal osteotomy with plate, screw fixation with any complicating factors. -Discussed transition to weightbearing as tolerated in the cam boot.  Continue ice and elevate.  Pain medication as needed.  Trula Slade DPM

## 2021-06-03 ENCOUNTER — Other Ambulatory Visit: Payer: Self-pay | Admitting: Podiatry

## 2021-06-04 MED ORDER — OXYCODONE-ACETAMINOPHEN 5-325 MG PO TABS: 1.0000 | ORAL_TABLET | Freq: Four times a day (QID) | ORAL | 0 refills | Status: DC | PRN

## 2021-06-04 NOTE — Telephone Encounter (Signed)
Please advise 

## 2021-06-14 ENCOUNTER — Other Ambulatory Visit: Payer: Self-pay | Admitting: Podiatry

## 2021-06-14 MED ORDER — IBUPROFEN 800 MG PO TABS: 800.0000 mg | ORAL_TABLET | Freq: Three times a day (TID) | ORAL | 0 refills | Status: DC | PRN

## 2021-06-14 MED ORDER — OXYCODONE-ACETAMINOPHEN 5-325 MG PO TABS: 1.0000 | ORAL_TABLET | Freq: Four times a day (QID) | ORAL | 0 refills | Status: DC | PRN

## 2021-06-15 ENCOUNTER — Other Ambulatory Visit: Payer: Self-pay

## 2021-06-15 ENCOUNTER — Ambulatory Visit (INDEPENDENT_AMBULATORY_CARE_PROVIDER_SITE_OTHER): Payer: Medicaid Other | Admitting: Podiatry

## 2021-06-15 DIAGNOSIS — M21611 Bunion of right foot: Secondary | ICD-10-CM

## 2021-06-20 ENCOUNTER — Encounter: Payer: Self-pay | Admitting: Podiatry

## 2021-06-20 NOTE — Progress Notes (Signed)
Subjective: ?Monique [name] is a 31 y.o. is seen today in office s/p right foot bunionectomy, condylectomy of the fourth metatarsal with excision of skin lesion preformed on 04/18/2021.  She is doing better but still having discomfort.  She takes the pain medication as needed.  She has noticed more discomfort in the top of the big toe and at times when she tries to bend her toe.  Some mild swelling still present.  She has been using the cam boot.  No recent injury or trauma.  No fevers, chills, nausea, vomiting.  No chest pain shortness of breath.  No other concerns.  ? ?Objective: ?General: No acute distress, AAOx3  ?DP/PT pulses palpable 2/4, CRT < 3 sec to all digits.  ?Protective sensation intact. Motor function intact.  ?Right foot: Incision is well coapted without any evidence of dehiscence and scar is formed.  There is still some mild edema present there is no erythema or warmth.  There is no drainage or pus.  Scabbing present with some callus tissue present along the incision submetatarsal.  There is no signs of infection noted today.  There is adequate range of motion of first MPJ but there is some discomfort with range of motion.  Osteotomy site appears to be stable. ?No pain with calf compression, swelling, warmth, erythema.  ? ? ? ? ? ? ?Assessment and Plan:  ?Status post right foot surgery, improving ? ?-Treatment options discussed including all alternatives, risks, and complications ?-At this point will refer to physical therapy.  Order was placed today for this.  I want her to continue to ice, elevate as well as compression of leg postoperative edema.  She can weight-bear as tolerated in the cam boot. ?-I just recently refilled her pain medicine.  Discussed switching to tramadol but she states this makes her sick.  Likely build to Tylenol 3 next refill. ?-Monitor for any clinical signs or symptoms of infection and directed to call the office immediately should any occur or go to the ER. ? ?Return in  about 4 weeks (around 07/13/2021).  X-ray next appointment ? ?Trula Slade DPM ? ?

## 2021-06-21 ENCOUNTER — Other Ambulatory Visit: Payer: Self-pay | Admitting: Podiatry

## 2021-06-21 MED ORDER — ACETAMINOPHEN-CODEINE #3 300-30 MG PO TABS: 1.0000 | ORAL_TABLET | Freq: Four times a day (QID) | ORAL | 0 refills | Status: DC | PRN

## 2021-06-21 NOTE — Telephone Encounter (Signed)
Please advise 

## 2021-06-25 ENCOUNTER — Encounter: Payer: Self-pay | Admitting: Podiatry

## 2021-06-25 ENCOUNTER — Other Ambulatory Visit: Payer: Self-pay | Admitting: Podiatry

## 2021-06-25 MED ORDER — CELECOXIB 100 MG PO CAPS: 100.0000 mg | ORAL_CAPSULE | Freq: Two times a day (BID) | ORAL | 1 refills | Status: DC

## 2021-06-25 NOTE — Telephone Encounter (Signed)
Please advise 

## 2021-06-26 ENCOUNTER — Other Ambulatory Visit: Payer: Self-pay | Admitting: Podiatry

## 2021-06-28 ENCOUNTER — Other Ambulatory Visit: Payer: Self-pay | Admitting: Podiatry

## 2021-06-28 MED ORDER — ACETAMINOPHEN-CODEINE #3 300-30 MG PO TABS: 1.0000 | ORAL_TABLET | Freq: Four times a day (QID) | ORAL | 0 refills | Status: DC | PRN

## 2021-07-05 ENCOUNTER — Other Ambulatory Visit: Payer: Self-pay | Admitting: Podiatry

## 2021-07-05 MED ORDER — ACETAMINOPHEN-CODEINE #3 300-30 MG PO TABS: 1.0000 | ORAL_TABLET | Freq: Four times a day (QID) | ORAL | 0 refills | Status: DC | PRN

## 2021-07-10 ENCOUNTER — Other Ambulatory Visit: Payer: Self-pay | Admitting: Podiatry

## 2021-07-11 ENCOUNTER — Ambulatory Visit (HOSPITAL_COMMUNITY): Payer: Medicaid Other | Attending: Podiatry | Admitting: Physical Therapy

## 2021-07-11 ENCOUNTER — Encounter (HOSPITAL_COMMUNITY): Payer: Self-pay | Admitting: Physical Therapy

## 2021-07-11 DIAGNOSIS — M25571 Pain in right ankle and joints of right foot: Secondary | ICD-10-CM | POA: Insufficient documentation

## 2021-07-11 DIAGNOSIS — R2689 Other abnormalities of gait and mobility: Secondary | ICD-10-CM | POA: Diagnosis present

## 2021-07-11 DIAGNOSIS — M21611 Bunion of right foot: Secondary | ICD-10-CM | POA: Diagnosis present

## 2021-07-11 MED ORDER — ACETAMINOPHEN-CODEINE #3 300-30 MG PO TABS: 1.0000 | ORAL_TABLET | Freq: Four times a day (QID) | ORAL | 0 refills | Status: DC | PRN

## 2021-07-11 MED ORDER — GABAPENTIN 100 MG PO CAPS: 100.0000 mg | ORAL_CAPSULE | Freq: Every day | ORAL | 0 refills | Status: DC

## 2021-07-11 NOTE — Telephone Encounter (Signed)
Please advise 

## 2021-07-11 NOTE — Patient Instructions (Signed)
Access Code: [email or web address] ?URL: [email or web address] ?Date: 07/11/2021 ?Prepared by: Mitzi Hansen Dajon Rowe ? ?Exercises ?- Towel Scrunches  - 3 x daily - 7 x weekly - 3 reps - 1 minute hold ?- Arch Lifting  - 3 x daily - 7 x weekly - 3 sets - 20 reps ?- Seated Toe Raise  - 3 x daily - 7 x weekly - 3 sets - 20 reps ?- Seated Self Great Toe Stretch  - 3 x daily - 7 x weekly - 3 sets - 20 reps - 5 second hold ?

## 2021-07-11 NOTE — Therapy (Signed)
?OUTPATIENT PHYSICAL THERAPY LOWER EXTREMITY EVALUATION ? ? ?Patient Name: Monique [name] ?MRN: [id number] ?DOB:Nov 05, 1990, 31 y.o., female ?Today's Date: 07/11/2021 ? ? PT End of Session - 07/11/21 1134   ? ? Visit Number 1   ? Number of Visits 12   ? Date for PT Re-Evaluation 08/22/21   ? Authorization Type Medicaid [employer]   ? Authorization Time Period 12 visits requested - check auth   ? Authorization - Visit Number 1   ? Authorization - Number of Visits 1   ? PT Start Time 1133   ? PT Stop Time 1210   ? PT Time Calculation (min) 37 min   ? Activity Tolerance Patient tolerated treatment well   ? Behavior During Therapy [facility] for tasks assessed/performed   ? ?  ?  ? ?  ? ? ?Past Medical History:  ?Diagnosis Date  ? 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 Bunion, right foot   ? GERD (gastroesophageal reflux disease)   ? ?Past Surgical History:  ?Procedure Laterality Date  ? Barbie Banner OSTEOTOMY Right 04/18/2021  ? Procedure: DOUBLE OSTEOTOMY;  Surgeon: Trula Slade, DPM;  Location: [facility];  Service: Podiatry;  Laterality: Right;  ? FOOT SURGERY Right   ? age 49  ? LESION REMOVAL Right 04/18/2021  ? Procedure: EXCISION BENIGN LESION;  Surgeon: Trula Slade, DPM;  Location: [place];  Service: Podiatry;  Laterality: Right;  ? METATARSAL HEAD EXCISION Right 04/18/2021  ? Procedure: CONDYLECTOMY;  Surgeon: Trula Slade, DPM;  Location: [facility];  Service: Podiatry;  Laterality: Right;  BLOCK  ? WISDOM TOOTH EXTRACTION    ? age 79  ? ?Patient Active Problem List  ? Diagnosis Date Noted  ? Cellulitis and abscess of foot, except toes 05/10/2016  ? ? ?PCP: Hyler, Gwen Her, NP ? ?REFERRING PROVIDER: Trula Slade, DPM ? ?REFERRING DIAG: G92.010 (ICD-10-CM) - Bunion, right  ? ?THERAPY DIAG:  ?Other abnormalities of gait and mobility - Plan: PT plan of care cert/re-cert ? ?Pain in right ankle and joints of right foot - Plan: PT plan of care cert/re-cert ? ?Bunion,  right - Plan: PT plan of care cert/re-cert ? ?ONSET DATE: 04/18/21 ? ?SUBJECTIVE:  ? ?SUBJECTIVE STATEMENT: ?She was having a lot of foot problems before surgery. She will transition out of boot into shoe when she goes for next appointment. They are going to fit her for a shoe. Foot still hurts but not as bad as it was. Has pain on top of foot. Pain when hitting toe.  ? ?PERTINENT HISTORY: ?s/p right foot bunionectomy, condylectomy of the fourth metatarsal with excision of skin lesion preformed on 04/18/2021  ? ?PAIN:  ?Are you having pain? No ? ?PRECAUTIONS: Other:   weight-bear as tolerated in the cam boot  ? ?WEIGHT BEARING RESTRICTIONS Yes weight-bear as tolerated in the cam boot  ? ?FALLS:  ?Has patient fallen in last 6 months? No ? ?LIVING ENVIRONMENT: ?Lives with: lives with their family and lives with their spouse ?Lives in: Mobile home ?Stairs: Yes: External: 1 steps; on right going up ?Has following equipment at home: Crutches ? 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 ?PLOF: Independent ? ?PATIENT GOALS Bend toe and be able to get it back to walk right ? ? ?OBJECTIVE:  ? ?Observation: ?Healing/healed surgical incisions at dorsal 4th met, 1st met, great toe IP, and plantar 4th digit ?COGNITION: ? Overall cognitive status: Within functional limits for  tasks assessed   ?  ?SENSATION: ?WFL ? ?POSTURE:  ?Slouched in seated ? ?PALPATION: ?TTP at all surgical sites ? ?LE ROM:  Decreased R great toe flexion extension ? ?Active ROM Right ?07/11/2021 Left ?07/11/2021  ?Hip flexion    ?Hip extension    ?Hip abduction    ?Hip adduction    ?Hip internal rotation    ?Hip external rotation    ?Knee flexion    ?Knee extension    ?Ankle dorsiflexion 10 10  ?Ankle plantarflexion 55 55  ?Ankle inversion 38 36  ?Ankle eversion 15 18  ? (Blank rows = not tested) ? ?LE MMT: ? ?MMT Right ?07/11/2021 Left ?07/11/2021  ?Hip flexion    ?Hip extension    ?Hip abduction    ?Hip adduction    ?Hip internal rotation    ?Hip  external rotation    ?Knee flexion    ?Knee extension    ?Ankle dorsiflexion 5/5 5/5  ?Ankle plantarflexion 5/5 5/5  ?Ankle inversion 5/5 5/5  ?Ankle eversion 5/5 5/5  ? (Blank rows = not tested) ? ? ?Functional tests: ?5x STS: 11.5 seconds without UE use, relies on LLE ? ?GAIT: ?Distance walked: 100 feet ?Assistive device utilized: None ?Level of assistance: Complete Independence ?Comments: CAM boot ? ? ? ?TODAY'S TREATMENT: ?07/11/21 ?Scar mobilizations Education and performance 4 minutes ?Towel scrunch 2x 1 minute ?Great toe flexion/extension PROM/AAROM x 10 5 second holds ?Foot doming x 20  ?Seated heel/toe raises x 20  ? ? ? ?PATIENT EDUCATION:  ?PATIENT EDUCATION:  ?Education details: Patient educated on exam findings, POC, scope of PT, HEP, and scar mobilizations. ?Person educated: Patient ?Education method: Explanation, Demonstration, and Handouts ?Education comprehension: verbalized understanding, returned demonstration, verbal cues required, and tactile cues required ? ? ?HOME EXERCISE PROGRAM: ?4/12 Scar mobilizations Education and performance 4 minutes ?Towel scrunch 2x 1 minute ?Great toe flexion/extension PROM/AAROM x 10 5 second holds ?Foot doming x 20  ?Seated heel/toe raises x 20  ? ?ASSESSMENT: ? ?CLINICAL IMPRESSION: ?Patient a 31 y.o. y.o. female who was seen today for physical therapy evaluation and treatment for s/p right foot bunionectomy, condylectomy of the fourth metatarsal with excision of skin lesion preformed on 04/18/2021. Patient will continue to benefit from physical therapy in order to improve function and reduce impairment. ? ? ? ?OBJECTIVE IMPAIRMENTS Abnormal gait, decreased activity tolerance, decreased balance, decreased endurance, decreased mobility, difficulty walking, decreased strength, hypomobility, impaired flexibility, improper body mechanics, and pain.  ? ?ACTIVITY LIMITATIONS cleaning, community activity, driving, meal prep, occupation, laundry, yard work, and shopping.   ? ?PERSONAL FACTORS Fitness and Time since onset of injury/illness/exacerbation are also affecting patient's functional outcome.  ? ? ?REHAB POTENTIAL: Good ? ?CLINICAL DECISION MAKING: Stable/uncomplicated ? ?EVALUATION COMPLEXITY: Low ? ? ?GOALS: ?Goals reviewed with patient? No ? ?SHORT TERM GOALS: Target date: 08/01/2021 ? ?Patient will be independent with HEP in order to improve functional outcomes. ?Baseline:  ?Goal status: INITIAL ? ?2.  Patient will report at least 25% improvement in symptoms for improved quality of life. ?Baseline:  ?Goal status: INITIAL ? ? ? ?LONG TERM GOALS: Target date: 08/22/2021 ? ?Patient will report at least 75% improvement in symptoms for improved quality of life. ?Baseline:  ?Goal status: INITIAL ? ?2.  Patient will be able to complete 5x STS in under 11.4 seconds without compensation in order to demonstrate improved R LE functional strength. ?Baseline:  ?Goal status: INITIAL ? ?3.  Patient will be able to navigate stairs with reciprocal pattern  without compensation in order to demonstrate improved LE strength. ?Baseline:  ?Goal status: INITIAL ? ?4. Patient will be able to return to all activities unrestricted for improved ability to perform work functions and participate with family.  ?Baseline:  ?Goal status: INITIAL ? ?5.  Patient will be able to perform SLS for at least 10 seconds when cleared by DPM in order to demonstrate improved balance for stair/curb navigation. ?Baseline:  ?Goal status: INITIAL ? ?  ? ? ?PLAN: ?PT FREQUENCY: 1-2x/week ? ?PT DURATION: 6 weeks ? ?PLANNED INTERVENTIONS: Therapeutic exercises, Therapeutic activity, Neuromuscular re-education, Balance training, Gait training, Patient/Family education, Joint manipulation, Joint mobilization, Stair training, Orthotic/Fit training, DME instructions, Aquatic Therapy, Dry Needling, Electrical stimulation, Spinal manipulation, Spinal mobilization, Cryotherapy, Moist heat, Compression bandaging, scar mobilization,  Splintting, Taping, Traction, Ultrasound, Ionotophoresis 34m/ml Dexamethasone, and Manual therapy ? ?PLAN FOR NEXT SESSION: f/u with HEP, DPM apt, and precautions/weightbearing status. Progress foot and R

## 2021-07-13 ENCOUNTER — Ambulatory Visit (INDEPENDENT_AMBULATORY_CARE_PROVIDER_SITE_OTHER): Payer: Medicaid Other

## 2021-07-13 ENCOUNTER — Ambulatory Visit (INDEPENDENT_AMBULATORY_CARE_PROVIDER_SITE_OTHER): Payer: Medicaid Other | Admitting: Podiatry

## 2021-07-13 DIAGNOSIS — Z9889 Other specified postprocedural states: Secondary | ICD-10-CM

## 2021-07-13 DIAGNOSIS — M21611 Bunion of right foot: Secondary | ICD-10-CM | POA: Diagnosis not present

## 2021-07-16 DIAGNOSIS — M21611 Bunion of right foot: Secondary | ICD-10-CM | POA: Insufficient documentation

## 2021-07-16 NOTE — Progress Notes (Signed)
Subjective: ?Monique [name] is a 31 y.o. is seen today in office s/p right foot bunionectomy, condylectomy of the fourth metatarsal with excision of skin lesion preformed on 04/18/2021.  She states that she is doing better.  She is decreasing out of pain medication but she is been taking there is been a couple days since she is not taking any.  She is still doing physical therapy.  No recent injuries or falls.  No new concerns.  ?  ? ?Objective: ?General: No acute distress, AAOx3  ?DP/PT pulses palpable 2/4, CRT < 3 sec to all digits.  ?Protective sensation intact. Motor function intact.  ?Right foot: Incision is well coapted without any evidence of dehiscence and scar is formed.  There is minimal edema present and overall the swelling appears to be improved.  Is no erythema or warmth.  Mild discomfort to palpation at surgical site on the bunion.  No other areas of discomfort noted.  No crepitation or restriction MPJ range of motion.  There is mild discomfort with first imaging range of motion. ?No pain with calf compression, swelling, warmth, erythema.  ? ? ?Assessment and Plan:  ?Status post right foot surgery, improving ? ?-Treatment options discussed including all alternatives, risks, and complications ?-3 views of the right foot were obtained for x-rays.  There is no evidence of acute fracture.  Hardware intact without any complicating factors. ?-At this time on her continue physical therapy.  Continue ice, elevate as well as compression likely postoperative edema.  Regular shoe as tolerated.  Gradual increase activity level as tolerated as well. ? ?Return in about 4 weeks (around 08/10/2021).  X-ray next appointment ? ?Trula Slade DPM ? ?

## 2021-07-17 ENCOUNTER — Other Ambulatory Visit: Payer: Self-pay | Admitting: Podiatry

## 2021-07-17 MED ORDER — ACETAMINOPHEN-CODEINE #3 300-30 MG PO TABS: 1.0000 | ORAL_TABLET | Freq: Four times a day (QID) | ORAL | 0 refills | Status: DC | PRN

## 2021-07-19 ENCOUNTER — Encounter (HOSPITAL_COMMUNITY): Payer: Self-pay

## 2021-07-19 ENCOUNTER — Ambulatory Visit (HOSPITAL_COMMUNITY): Payer: Medicaid Other

## 2021-07-19 DIAGNOSIS — M25571 Pain in right ankle and joints of right foot: Secondary | ICD-10-CM

## 2021-07-19 DIAGNOSIS — R2689 Other abnormalities of gait and mobility: Secondary | ICD-10-CM

## 2021-07-19 DIAGNOSIS — M21611 Bunion of right foot: Secondary | ICD-10-CM

## 2021-07-19 NOTE — Therapy (Signed)
?OUTPATIENT PHYSICAL THERAPY TREATMENT NOTE ? ? ?Patient Name: Monique [name] ?MRN: [id number] ?DOB:July 20, 1990, 31 y.o., female ?Today's Date: 07/19/2021 ? ?PCP: Hyler, Gwen Her, NP ?REFERRING PROVIDER: Hyler, Gwen Her, NP ? ?END OF SESSION:  ? PT End of Session - 07/19/21 1352   ? ? Visit Number 2   ? Number of Visits 12   ? Date for PT Re-Evaluation 08/22/21   ? Authorization Type Medicaid [employer]   ? Authorization Time Period 12 visits requested - check auth as not seen yet on 07/19/21   ? Authorization - Visit Number 2   ? Authorization - Number of Visits --   ? PT Start Time 1346   ? PT Stop Time 1428   ? PT Time Calculation (min) 42 min   ? Equipment Utilized During Treatment Other (comment)   CAM boot  ? Activity Tolerance Patient tolerated treatment well   ? Behavior During Therapy [facility] for tasks assessed/performed   ? ?  ?  ? ?  ? ? ?Past Medical History:  ?Diagnosis Date  ? 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 Bunion, right foot   ? GERD (gastroesophageal reflux disease)   ? ?Past Surgical History:  ?Procedure Laterality Date  ? Barbie Banner OSTEOTOMY Right 04/18/2021  ? Procedure: DOUBLE OSTEOTOMY;  Surgeon: Trula Slade, DPM;  Location: [facility];  Service: Podiatry;  Laterality: Right;  ? FOOT SURGERY Right   ? age 5  ? LESION REMOVAL Right 04/18/2021  ? Procedure: EXCISION BENIGN LESION;  Surgeon: Trula Slade, DPM;  Location: [place];  Service: Podiatry;  Laterality: Right;  ? METATARSAL HEAD EXCISION Right 04/18/2021  ? Procedure: CONDYLECTOMY;  Surgeon: Trula Slade, DPM;  Location: [facility];  Service: Podiatry;  Laterality: Right;  BLOCK  ? WISDOM TOOTH EXTRACTION    ? age 73  ? ?Patient Active Problem List  ? Diagnosis Date Noted  ? Bunion, right 07/16/2021  ? Cellulitis and abscess of foot, except toes 05/10/2016  ? ? ?REFERRING DIAG: M21.611 (ICD-10-CM) - Bunion, right  ? ?THERAPY DIAG:  ?Other abnormalities of gait and mobility ? ?Pain in  right ankle and joints of right foot ? ?Bunion, right ? ?PERTINENT HISTORY: s/p right foot bunionectomy, condylectomy of the fourth metatarsal with excision of skin lesion preformed on 04/18/2021  ? ?PRECAUTIONS: Other:   weight-bear as tolerated in the cam boot  ? ?SUBJECTIVE: With getting into the new exercises and working out of boot, some increased swelling and irritation present.  Patient also reporting prior inner ankle tendonitis that has some awareness now that she is moving foot again, pointing at medial ankle and down toward arch.  ? ?PAIN:  ?Are you having pain? Yes: NPRS scale: 5/10 ?Pain location: right great toe, top of foot, and medial ankle ?Pain description: "hurts"  ?Aggravating factors: walking, weight bearing, touching ?Relieving factors: tylenol rx, ice at night, elevation ? ? ? ?OBJECTIVE:  ? Italics from evaluation on 07/11/21 ?Observation: ?Healing/healed surgical incisions at dorsal 4th met, 1st met, great toe IP, and plantar 4th digit ? ?PALPATION: ?TTP at all surgical sites ?  ?LE ROM:  Decreased R great toe flexion extension ?  ?Active ROM Right ?07/11/2021 Left ?07/11/2021  ?Hip flexion      ?Hip extension      ?Hip abduction      ?Hip adduction      ?Hip internal rotation      ?Hip external rotation      ?  Knee flexion      ?Knee extension      ?Ankle dorsiflexion 10 10  ?Ankle plantarflexion 55 55  ?Ankle inversion 38 36  ?Ankle eversion 15 18  ? (Blank rows = not tested) ?  ?LE MMT: ?  ?MMT Right ?07/11/2021 Left ?07/11/2021  ?Hip flexion      ?Hip extension      ?Hip abduction      ?Hip adduction      ?Hip internal rotation      ?Hip external rotation      ?Knee flexion      ?Knee extension      ?Ankle dorsiflexion 5/5 5/5  ?Ankle plantarflexion 5/5 5/5  ?Ankle inversion 5/5 5/5  ?Ankle eversion 5/5 5/5  ? (Blank rows = not tested) ?  ?  ?Functional tests: ?5x STS: 11.5 seconds without UE use, relies on LLE ?  ?GAIT: ?Distance walked: 100 feet ?Assistive device utilized: None ?Level of  assistance: Complete Independence ?Comments: CAM boot ?  ?  ?  ?TODAY'S TREATMENT: ?07/19/21 ?Reviewing HEP of Towel scrunch 2x 1 minute ?Great toe flexion/extension PROM/AAROM x 10 5 second holds ?Foot doming x 20  ?Seated heel/toe raises x 20  ?Add = windshield wipers foot inversion and eversion 2 x 10 with cue for toe activation, curl in and extend out  ?Manual Therapy = supine for STM to right ankle to toes for edema control and into scar mobilizations and gentle grade II great toe MTP joint mobilization flexion/extension and then PROM great toe flexion/extension; SLR supine with STM for elevation  ? ?07/11/21 ?Scar mobilizations Education and performance 4 minutes ?Towel scrunch 2x 1 minute ?Great toe flexion/extension PROM/AAROM x 10 5 second holds ?Foot doming x 20  ?Seated heel/toe raises x 20  ?  ?  ?  ?PATIENT EDUCATION:  ?PATIENT EDUCATION:  ?Education details: Patient educated on exam findings, POC, scope of PT, HEP, and scar mobilizations. ?Person educated: Patient ?Education method: Explanation, Demonstration, and Handouts ?Education comprehension: verbalized understanding, returned demonstration, verbal cues required, and tactile cues required ?  ?  ?HOME EXERCISE PROGRAM: ?4/12 Scar mobilizations Education and performance 4 minutes ?Towel scrunch 2x 1 minute ?Great toe flexion/extension PROM/AAROM x 10 5 second holds ?Foot doming x 20  ?Seated heel/toe raises x 20  ?  ?ASSESSMENT: ?  ?CLINICAL IMPRESSION: ?Patient a 31 y.o. y.o. female who was seen today for physical therapy treatment today with review of goals and HEP.  Good form, some irritation to prior medial ankle tendonitis that was support with addition ankle opening activity and STM.  Manual work trialed for support of edema and post operative scar work and ROM work as able with good tolerance in session.  Patient will continue to benefit from physical therapy in order to improve function and reduce impairment. ?  ?  ?  ?OBJECTIVE IMPAIRMENTS  Abnormal gait, decreased activity tolerance, decreased balance, decreased endurance, decreased mobility, difficulty walking, decreased strength, hypomobility, impaired flexibility, improper body mechanics, and pain.  ?  ?ACTIVITY LIMITATIONS cleaning, community activity, driving, meal prep, occupation, laundry, yard work, and shopping.  ?  ?PERSONAL FACTORS Fitness and Time since onset of injury/illness/exacerbation are also affecting patient's functional outcome.  ?  ?  ?REHAB POTENTIAL: Good ?  ?CLINICAL DECISION MAKING: Stable/uncomplicated ?  ?EVALUATION COMPLEXITY: Low ?  ?  ?GOALS: ?Goals reviewed with patient? YES 07/19/21 ?  ?SHORT TERM GOALS: Target date: 08/01/2021 ?  ?Patient will be independent with HEP in order to improve  functional outcomes. ?Baseline:  ?Goal status: ONGOING ?  ?2.  Patient will report at least 25% improvement in symptoms for improved quality of life. ?Baseline:  ?Goal status: ONGOING ?  ?  ?  ?LONG TERM GOALS: Target date: 08/22/2021 ?  ?Patient will report at least 75% improvement in symptoms for improved quality of life. ?Baseline:  ?Goal status: ONGOING ?  ?2.  Patient will be able to complete 5x STS in under 11.4 seconds without compensation in order to demonstrate improved R LE functional strength. ?Baseline:  ?Goal status: ONGOING ?  ?3.  Patient will be able to navigate stairs with reciprocal pattern without compensation in order to demonstrate improved LE strength. ?Baseline:  ?Goal status: ONGOING ?  ?4. Patient will be able to return to all activities unrestricted for improved ability to perform work functions and participate with family.  ?Baseline:  ?Goal status: ONGOING ?  ?5.  Patient will be able to perform SLS for at least 10 seconds when cleared by DPM in order to demonstrate improved balance for stair/curb navigation. ?Baseline:  ?Goal status: ONGOING ?  ?  ?  ?  ?PLAN: ?PT FREQUENCY: 1-2x/week ?  ?PT DURATION: 6 weeks ?  ?PLANNED INTERVENTIONS: Therapeutic exercises,  Therapeutic activity, Neuromuscular re-education, Balance training, Gait training, Patient/Family education, Joint manipulation, Joint mobilization, Stair training, Orthotic/Fit training, DME instructions, A

## 2021-07-25 ENCOUNTER — Encounter (HOSPITAL_COMMUNITY): Payer: Medicaid Other

## 2021-07-25 ENCOUNTER — Other Ambulatory Visit: Payer: Self-pay | Admitting: Podiatry

## 2021-07-26 ENCOUNTER — Other Ambulatory Visit: Payer: Self-pay | Admitting: Podiatry

## 2021-07-26 NOTE — Telephone Encounter (Signed)
Please advise 

## 2021-07-28 ENCOUNTER — Other Ambulatory Visit: Payer: Self-pay | Admitting: Podiatry

## 2021-07-31 ENCOUNTER — Encounter: Payer: Self-pay | Admitting: Podiatry

## 2021-07-31 MED ORDER — GABAPENTIN 100 MG PO CAPS: 100.0000 mg | ORAL_CAPSULE | Freq: Every day | ORAL | 0 refills | Status: DC

## 2021-07-31 MED ORDER — ACETAMINOPHEN-CODEINE #3 300-30 MG PO TABS: 1.0000 | ORAL_TABLET | Freq: Four times a day (QID) | ORAL | 0 refills | Status: DC | PRN

## 2021-08-01 ENCOUNTER — Ambulatory Visit (HOSPITAL_COMMUNITY): Payer: Medicaid Other | Attending: Podiatry | Admitting: Physical Therapy

## 2021-08-01 DIAGNOSIS — R2689 Other abnormalities of gait and mobility: Secondary | ICD-10-CM | POA: Insufficient documentation

## 2021-08-01 DIAGNOSIS — M25571 Pain in right ankle and joints of right foot: Secondary | ICD-10-CM | POA: Insufficient documentation

## 2021-08-01 DIAGNOSIS — M21611 Bunion of right foot: Secondary | ICD-10-CM | POA: Insufficient documentation

## 2021-08-03 ENCOUNTER — Other Ambulatory Visit: Payer: Self-pay | Admitting: Podiatry

## 2021-08-03 MED ORDER — ACETAMINOPHEN-CODEINE #3 300-30 MG PO TABS: 1.0000 | ORAL_TABLET | Freq: Four times a day (QID) | ORAL | 0 refills | Status: DC | PRN

## 2021-08-06 ENCOUNTER — Encounter: Payer: Self-pay | Admitting: Podiatry

## 2021-08-06 ENCOUNTER — Other Ambulatory Visit: Payer: Self-pay | Admitting: Podiatry

## 2021-08-06 MED ORDER — HYDROCODONE-ACETAMINOPHEN 5-325 MG PO TABS: 1.0000 | ORAL_TABLET | Freq: Four times a day (QID) | ORAL | 0 refills | Status: DC | PRN

## 2021-08-09 ENCOUNTER — Ambulatory Visit (HOSPITAL_COMMUNITY): Payer: Medicaid Other | Admitting: Physical Therapy

## 2021-08-09 ENCOUNTER — Encounter (HOSPITAL_COMMUNITY): Payer: Self-pay | Admitting: Physical Therapy

## 2021-08-09 DIAGNOSIS — M25571 Pain in right ankle and joints of right foot: Secondary | ICD-10-CM

## 2021-08-09 DIAGNOSIS — R2689 Other abnormalities of gait and mobility: Secondary | ICD-10-CM | POA: Diagnosis present

## 2021-08-09 DIAGNOSIS — M21611 Bunion of right foot: Secondary | ICD-10-CM | POA: Diagnosis present

## 2021-08-09 NOTE — Therapy (Addendum)
?OUTPATIENT PHYSICAL THERAPY TREATMENT NOTE ? ? ?Patient Name: Monique [name] ?MRN: [id number] ?DOB:02/17/1991, 31 y.o., female ?Today's Date: 08/09/2021 ? ?PCP: Hyler, Gwen Her, NP ?REFERRING PROVIDER: Trula Slade,  ? ?PHYSICAL THERAPY DISCHARGE SUMMARY ? ?Visits from Start of Care: 3 ? ?Current functional level related to goals / functional outcomes: ?See below ?  ?Remaining deficits: ?See below ?  ?Education / Equipment: ?See below  ? ?Patient agrees to discharge. Patient goals were met. Patient is being discharged due to meeting the stated rehab goals. ? ? ?END OF SESSION:  ? PT End of Session - 08/09/21 1321   ? ? Visit Number 3   ? Number of Visits 12   ? Date for PT Re-Evaluation 08/22/21   ? Authorization Type Medicaid [employer]   ? Authorization Time Period 12 visits approved 4/12 - 5/24   ? Authorization - Visit Number 3   ? Authorization - Number of Visits 12   ? PT Start Time 0277   ? PT Stop Time 1335   ? PT Time Calculation (min) 14 min   ? Equipment Utilized During Treatment --   ? Activity Tolerance Patient tolerated treatment well   ? Behavior During Therapy [facility] for tasks assessed/performed   ? ?  ?  ? ?  ? ? ?Past Medical History:  ?Diagnosis Date  ? 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 Bunion, right foot   ? GERD (gastroesophageal reflux disease)   ? ?Past Surgical History:  ?Procedure Laterality Date  ? Barbie Banner OSTEOTOMY Right 04/18/2021  ? Procedure: DOUBLE OSTEOTOMY;  Surgeon: Trula Slade, DPM;  Location: [facility];  Service: Podiatry;  Laterality: Right;  ? FOOT SURGERY Right   ? age 66  ? LESION REMOVAL Right 04/18/2021  ? Procedure: EXCISION BENIGN LESION;  Surgeon: Trula Slade, DPM;  Location: [place];  Service: Podiatry;  Laterality: Right;  ? METATARSAL HEAD EXCISION Right 04/18/2021  ? Procedure: CONDYLECTOMY;  Surgeon: Trula Slade, DPM;  Location: [facility];  Service: Podiatry;  Laterality: Right;  BLOCK  ? WISDOM  TOOTH EXTRACTION    ? age 43  ? ?Patient Active Problem List  ? Diagnosis Date Noted  ? Bunion, right 07/16/2021  ? Cellulitis and abscess of foot, except toes 05/10/2016  ? ? ?REFERRING DIAG: M21.611 (ICD-10-CM) - Bunion, right  ? ?THERAPY DIAG:  ?Other abnormalities of gait and mobility ? ?Pain in right ankle and joints of right foot ? ?Bunion, right ? ?PERTINENT HISTORY: s/p right foot bunionectomy, condylectomy of the fourth metatarsal with excision of skin lesion preformed on 04/18/2021  ? ?PRECAUTIONS: Other:   weight-bear as tolerated in the cam boot  ? ?SUBJECTIVE: Patient states she hurt her foot Saturday when walking and stepping on something. Got some pain meds helping a bit and not throbbing as much. Has had trouble bending toe since yesterday. Feels 80% improved and would be more if foot wasn't swollen.  ? ?PAIN:  ?Are you having pain? Yes: NPRS scale: 2/10 ?Pain location: right great toe, top of foot, and medial ankle ?Pain description: "hurts"  ?Aggravating factors: walking, weight bearing, touching ?Relieving factors: tylenol rx, ice at night, elevation ? ? ? ?OBJECTIVE:  ? Italics from evaluation on 07/11/21 ?Observation: ?Healing/healed surgical incisions at dorsal 4th met, 1st met, great toe IP, and plantar 4th digit ?5/11: dorsal edema at 1st met and 2nd met with tenderness ? ?PALPATION: ?TTP at all surgical sites ?  ?  LE ROM:  Decreased R great toe flexion extension ?  ?Active ROM Right ?07/11/2021 Left ?07/11/2021  ?Hip flexion      ?Hip extension      ?Hip abduction      ?Hip adduction      ?Hip internal rotation      ?Hip external rotation      ?Knee flexion      ?Knee extension      ?Ankle dorsiflexion 10 10  ?Ankle plantarflexion 55 55  ?Ankle inversion 38 36  ?Ankle eversion 15 18  ? (Blank rows = not tested) ?  ?LE MMT: ?  ?MMT Right ?07/11/2021 Left ?07/11/2021  ?Hip flexion      ?Hip extension      ?Hip abduction      ?Hip adduction      ?Hip internal rotation      ?Hip external rotation       ?Knee flexion      ?Knee extension      ?Ankle dorsiflexion 5/5 5/5  ?Ankle plantarflexion 5/5 5/5  ?Ankle inversion 5/5 5/5  ?Ankle eversion 5/5 5/5  ? (Blank rows = not tested) ?  ?  ?Functional tests: ?5x STS: 11.5 seconds without UE use, relies on LLE ?  ?GAIT: ?Distance walked: 100 feet ?Assistive device utilized: None ?Level of assistance: Complete Independence ?Comments: CAM boot ? ?08/09/21  ?Balance : >30 seconds bilateral with minimal sway ?Stairs: alternating pattern without UE support ?Gait: 100 feet with sneaker WFL ?  ?  ?TODAY'S TREATMENT: ?08/09/21 ?Patient with ROM WFL, edema at 1st/2nd met with slight tenderness ?Reassessment ? ?07/19/21 ?Reviewing HEP of Towel scrunch 2x 1 minute ?Great toe flexion/extension PROM/AAROM x 10 5 second holds ?Foot doming x 20  ?Seated heel/toe raises x 20  ?Add = windshield wipers foot inversion and eversion 2 x 10 with cue for toe activation, curl in and extend out  ?Manual Therapy = supine for STM to right ankle to toes for edema control and into scar mobilizations and gentle grade II great toe MTP joint mobilization flexion/extension and then PROM great toe flexion/extension; SLR supine with STM for elevation  ? ?07/11/21 ?Scar mobilizations Education and performance 4 minutes ?Towel scrunch 2x 1 minute ?Great toe flexion/extension PROM/AAROM x 10 5 second holds ?Foot doming x 20  ?Seated heel/toe raises x 20  ?  ?  ?  ?PATIENT EDUCATION:  ?PATIENT EDUCATION:  ?Education details: Patient educated on exam findings, POC, scope of PT, HEP, and scar mobilizations. 5/11 HEP, returning to PT if needed ?Person educated: Patient ?Education method: Explanation, Demonstration, and Handouts ?Education comprehension: verbalized understanding, returned demonstration, verbal cues required, and tactile cues required ?  ?  ?HOME EXERCISE PROGRAM: ?4/12 Scar mobilizations Education and performance 4 minutes ?Towel scrunch 2x 1 minute ?Great toe flexion/extension PROM/AAROM x 10 5  second holds ?Foot doming x 20  ?Seated heel/toe raises x 20  ?  ?ASSESSMENT: ?  ?CLINICAL IMPRESSION: ?Patient has met all short and long term goals with ability to complete HEP and improved symptoms, balance activity tolerance, strength and functional mobility. Patient with edema at metatarsals which she states is new since injury over the weekend. Patient overall functioning well but does have continued symptoms. Patient educated on returning to PT if needed. Patient discharged from physical therapy at this time. ?  ?  ?  ?OBJECTIVE IMPAIRMENTS Abnormal gait, decreased activity tolerance, decreased balance, decreased endurance, decreased mobility, difficulty walking, decreased strength, hypomobility, impaired flexibility, improper body mechanics,  and pain.  ?  ?ACTIVITY LIMITATIONS cleaning, community activity, driving, meal prep, occupation, laundry, yard work, and shopping.  ?  ?PERSONAL FACTORS Fitness and Time since onset of injury/illness/exacerbation are also affecting patient's functional outcome.  ?  ?  ?REHAB POTENTIAL: Good ?  ?CLINICAL DECISION MAKING: Stable/uncomplicated ?  ?EVALUATION COMPLEXITY: Low ?  ?  ?GOALS: ?Goals reviewed with patient? YES 07/19/21 ?  ?SHORT TERM GOALS: Target date: 08/01/2021 ?  ?Patient will be independent with HEP in order to improve functional outcomes. ?Baseline:  ?Goal status: Met ?  ?2.  Patient will report at least 25% improvement in symptoms for improved quality of life. ?Baseline: 5/11 states 80% improvement ?Goal status: MET ?  ?  ?  ?LONG TERM GOALS: Target date: 08/22/2021 ?  ?Patient will report at least 75% improvement in symptoms for improved quality of life. ?Baseline:  ?Goal status: Met ?  ?2.  Patient will be able to complete 5x STS in under 11.4 seconds without compensation in order to demonstrate improved R LE functional strength. ?Baseline: 5/11 5.88 seconds ?Goal status: met ?  ?3.  Patient will be able to navigate stairs with reciprocal pattern without  compensation in order to demonstrate improved LE strength. ?Baseline: 5/11 alternating pattern good mechanics without UE support ?Goal status: Met ?  ?4. Patient will be able to return to all activities unrestr

## 2021-08-11 ENCOUNTER — Other Ambulatory Visit: Payer: Self-pay | Admitting: Podiatry

## 2021-08-11 MED ORDER — HYDROCODONE-ACETAMINOPHEN 5-325 MG PO TABS: 1.0000 | ORAL_TABLET | Freq: Four times a day (QID) | ORAL | 0 refills | Status: DC | PRN

## 2021-08-13 ENCOUNTER — Encounter: Payer: Medicaid Other | Admitting: Podiatry

## 2021-08-14 ENCOUNTER — Other Ambulatory Visit: Payer: Self-pay | Admitting: Sports Medicine

## 2021-08-14 ENCOUNTER — Other Ambulatory Visit: Payer: Self-pay | Admitting: Podiatry

## 2021-08-15 ENCOUNTER — Other Ambulatory Visit: Payer: Self-pay | Admitting: Sports Medicine

## 2021-08-16 ENCOUNTER — Ambulatory Visit (HOSPITAL_COMMUNITY): Payer: Medicaid Other | Admitting: Physical Therapy

## 2021-08-17 ENCOUNTER — Encounter: Payer: Self-pay | Admitting: Podiatry

## 2021-08-17 ENCOUNTER — Other Ambulatory Visit: Payer: Self-pay | Admitting: Sports Medicine

## 2021-08-17 MED ORDER — ACETAMINOPHEN-CODEINE #3 300-30 MG PO TABS: 1.0000 | ORAL_TABLET | Freq: Four times a day (QID) | ORAL | 0 refills | Status: DC | PRN

## 2021-08-17 NOTE — Telephone Encounter (Signed)
Please advise 

## 2021-08-20 NOTE — Telephone Encounter (Signed)
Please advise 

## 2021-08-21 ENCOUNTER — Other Ambulatory Visit: Payer: Self-pay | Admitting: Sports Medicine

## 2021-08-21 ENCOUNTER — Encounter: Payer: Self-pay | Admitting: Podiatry

## 2021-08-21 NOTE — Telephone Encounter (Signed)
Please advise 

## 2021-08-22 ENCOUNTER — Other Ambulatory Visit: Payer: Self-pay | Admitting: Podiatry

## 2021-08-22 MED ORDER — ACETAMINOPHEN-CODEINE 300-30 MG PO TABS: 1.0000 | ORAL_TABLET | Freq: Four times a day (QID) | ORAL | 0 refills | Status: DC | PRN

## 2021-08-22 NOTE — Telephone Encounter (Signed)
Can someone please see if we can get this patient is sooner? Thanks!

## 2021-08-23 ENCOUNTER — Telehealth: Payer: Self-pay | Admitting: Podiatry

## 2021-08-23 ENCOUNTER — Ambulatory Visit (HOSPITAL_COMMUNITY): Payer: Medicaid Other | Admitting: Physical Therapy

## 2021-08-23 NOTE — Telephone Encounter (Signed)
Pt called stating that she was to get a work in appt today but haven't heart back from anyone. I see the message to have her come in today at 145 but she called after this time. I offered her a 75 but she says it will take her 45 mins to get here so she wouldn't make it. She states she is getting married tomorrow and wouldn't be able to be seen until next week. She states she is still in a lot of pain and wants pain medication called in other than tylenol in which she says its not helping. She has a post op appt on 6/1 and she says if she cant be seen before that, then she will do her best to make it to that appt.

## 2021-08-24 NOTE — Telephone Encounter (Signed)
I called and spoke to patient and she is aware of the recommendations from Dr. Jacqualyn Posey about the medication plan.Pt is agreeable and will call if any other issues arise.

## 2021-08-26 ENCOUNTER — Other Ambulatory Visit: Payer: Self-pay | Admitting: Podiatry

## 2021-08-29 ENCOUNTER — Other Ambulatory Visit: Payer: Self-pay | Admitting: Podiatry

## 2021-08-29 MED ORDER — ACETAMINOPHEN-CODEINE 300-30 MG PO TABS
1.0000 | ORAL_TABLET | Freq: Four times a day (QID) | ORAL | 0 refills | Status: DC | PRN
Start: 2021-08-29 — End: 2021-08-30

## 2021-08-30 ENCOUNTER — Ambulatory Visit (INDEPENDENT_AMBULATORY_CARE_PROVIDER_SITE_OTHER): Payer: Medicaid Other | Admitting: Podiatry

## 2021-08-30 ENCOUNTER — Ambulatory Visit (INDEPENDENT_AMBULATORY_CARE_PROVIDER_SITE_OTHER): Payer: Medicaid Other

## 2021-08-30 DIAGNOSIS — S92314K Nondisplaced fracture of first metatarsal bone, right foot, subsequent encounter for fracture with nonunion: Secondary | ICD-10-CM

## 2021-08-30 DIAGNOSIS — Z9889 Other specified postprocedural states: Secondary | ICD-10-CM

## 2021-08-30 DIAGNOSIS — M7751 Other enthesopathy of right foot: Secondary | ICD-10-CM | POA: Diagnosis not present

## 2021-08-30 DIAGNOSIS — M21611 Bunion of right foot: Secondary | ICD-10-CM

## 2021-08-30 MED ORDER — HYDROCODONE-ACETAMINOPHEN 5-325 MG PO TABS: 1.0000 | ORAL_TABLET | Freq: Three times a day (TID) | ORAL | 0 refills | Status: DC | PRN

## 2021-09-01 NOTE — Progress Notes (Signed)
Subjective: Monique [name] is a 31 y.o. is seen today in office s/p right foot bunionectomy, condylectomy of the fourth metatarsal with excision of skin lesion preformed on 04/18/2021.  She states that she has had increased pain along the bunion surgery sites in the last saw her.  She states that she did have an injury where she hit her toe and since then she had some increased pain.  She is been on Tylenol 3 which has not been helping.  She does take gabapentin at nighttime as well as anti-inflammatories during the day.  She is wearing regular sandals today.  No fevers or chills.    Objective: General: No acute distress, AAOx3  DP/PT pulses palpable 2/4, CRT < 3 sec to all digits.  Protective sensation intact. Motor function intact.  Right foot: Incision is well coapted without any evidence of dehiscence and scar is formed.  There are no open lesions.  There does appear to be some increased edema present on the first MPJ on the area the bunion site.  There is no erythema or warmth associated this.  Tenderness palpation of this area.  No other areas of discomfort. No pain with calf compression, swelling, warmth, erythema.    Assessment and Plan:  Status post right foot surgery, nonunion  -Treatment options discussed including all alternatives, risks, and complications -Repeat x-rays obtained reviewed.  3 views were obtained.  Compared to prior x-rays there does appear to be some displacement of the distal metatarsal.  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 -Due to x-ray findings we will order her bone stimulator and she is in agreement to this.  Surgical shoe for immobilization.  I will check a vitamin D level as well.  Ice, elevation.  Prescribed Vicodin for her but also discussed increasing gabapentin 3 times a day as well as using anti-inflammatories.  We will try to use narcotics sparingly.  Return in about 3 weeks (around  09/20/2021).  Repeat x-ray  Trula Slade DPM

## 2021-09-05 ENCOUNTER — Other Ambulatory Visit: Payer: Self-pay | Admitting: Podiatry

## 2021-09-06 ENCOUNTER — Other Ambulatory Visit: Payer: Self-pay | Admitting: Podiatry

## 2021-09-06 MED ORDER — HYDROCODONE-ACETAMINOPHEN 5-325 MG PO TABS: 1.0000 | ORAL_TABLET | Freq: Three times a day (TID) | ORAL | 0 refills | Status: DC | PRN

## 2021-09-06 NOTE — Telephone Encounter (Signed)
Please advise 

## 2021-09-14 ENCOUNTER — Other Ambulatory Visit: Payer: Self-pay | Admitting: Podiatry

## 2021-09-16 MED ORDER — HYDROCODONE-ACETAMINOPHEN 5-325 MG PO TABS: 1.0000 | ORAL_TABLET | Freq: Three times a day (TID) | ORAL | 0 refills | Status: DC | PRN

## 2021-09-21 ENCOUNTER — Other Ambulatory Visit: Payer: Self-pay | Admitting: Podiatry

## 2021-09-23 ENCOUNTER — Other Ambulatory Visit: Payer: Self-pay | Admitting: Podiatry

## 2021-09-24 ENCOUNTER — Other Ambulatory Visit: Payer: Self-pay | Admitting: Podiatry

## 2021-09-24 ENCOUNTER — Encounter: Payer: Self-pay | Admitting: Podiatry

## 2021-09-24 ENCOUNTER — Ambulatory Visit: Payer: Medicaid Other | Admitting: Podiatry

## 2021-09-24 MED ORDER — GABAPENTIN 100 MG PO CAPS: 100.0000 mg | ORAL_CAPSULE | Freq: Every day | ORAL | 0 refills | Status: DC

## 2021-09-24 MED ORDER — HYDROCODONE-ACETAMINOPHEN 5-325 MG PO TABS: 1.0000 | ORAL_TABLET | Freq: Three times a day (TID) | ORAL | 0 refills | Status: DC | PRN

## 2021-09-27 NOTE — Telephone Encounter (Signed)
Can one of you please follow up on the bone stimulator? Thanks.

## 2021-09-28 NOTE — Telephone Encounter (Signed)
Which company is the bone stimulator thru?

## 2021-09-29 ENCOUNTER — Other Ambulatory Visit: Payer: Self-pay | Admitting: Podiatry

## 2021-09-30 MED ORDER — HYDROCODONE-ACETAMINOPHEN 5-325 MG PO TABS: 1.0000 | ORAL_TABLET | Freq: Three times a day (TID) | ORAL | 0 refills | Status: DC | PRN

## 2021-10-05 ENCOUNTER — Other Ambulatory Visit: Payer: Self-pay | Admitting: Podiatry

## 2021-10-07 ENCOUNTER — Other Ambulatory Visit: Payer: Self-pay | Admitting: Podiatry

## 2021-10-08 ENCOUNTER — Other Ambulatory Visit: Payer: Self-pay | Admitting: Podiatry

## 2021-10-08 MED ORDER — HYDROCODONE-ACETAMINOPHEN 5-325 MG PO TABS: 1.0000 | ORAL_TABLET | Freq: Three times a day (TID) | ORAL | 0 refills | Status: DC | PRN

## 2021-10-09 NOTE — Telephone Encounter (Signed)
Can you call the [employer] Rep and see what the status of this is? Thanks.

## 2021-10-15 ENCOUNTER — Ambulatory Visit (INDEPENDENT_AMBULATORY_CARE_PROVIDER_SITE_OTHER): Payer: Medicaid Other | Admitting: Podiatry

## 2021-10-15 ENCOUNTER — Other Ambulatory Visit: Payer: Self-pay | Admitting: Podiatry

## 2021-10-15 ENCOUNTER — Ambulatory Visit (INDEPENDENT_AMBULATORY_CARE_PROVIDER_SITE_OTHER): Payer: Medicaid Other

## 2021-10-15 DIAGNOSIS — S92314K Nondisplaced fracture of first metatarsal bone, right foot, subsequent encounter for fracture with nonunion: Secondary | ICD-10-CM | POA: Diagnosis not present

## 2021-10-15 DIAGNOSIS — Z9889 Other specified postprocedural states: Secondary | ICD-10-CM | POA: Diagnosis not present

## 2021-10-15 DIAGNOSIS — M21611 Bunion of right foot: Secondary | ICD-10-CM

## 2021-10-15 MED ORDER — GABAPENTIN 100 MG PO CAPS
100.0000 mg | ORAL_CAPSULE | Freq: Three times a day (TID) | ORAL | 0 refills | Status: DC
Start: 2021-10-15 — End: 2021-12-27

## 2021-10-15 MED ORDER — HYDROCODONE-ACETAMINOPHEN 5-325 MG PO TABS: 1.0000 | ORAL_TABLET | Freq: Three times a day (TID) | ORAL | 0 refills | Status: DC | PRN

## 2021-10-16 NOTE — Progress Notes (Addendum)
Subjective: Monique [name] is a 31 y.o. is seen today in office s/p right foot bunionectomy, condylectomy of the fourth metatarsal with excision of skin lesion preformed on 04/18/2021.  She states that she has not yet heard about the bone stimulator.  She is still in the surgical shoe she is icing and elevating.  She is taking the pain medication, hydrocodone as needed but she states that she try not to take it as much.  She taking gabapentin once a day.  She is not taking any additional Tylenol.  And occasional ibuprofen.  No recent injury or changes otherwise.   Objective: General: No acute distress, AAOx3  DP/PT pulses palpable 2/4, CRT < 3 sec to all digits.  Protective sensation intact. Motor function intact.  Right foot: Incision is well coapted without any evidence of dehiscence and scar is formed.  There is edema still present along the surgical site and there is tenderness palpation to the left first metatarsal.  No other areas of pinpoint tenderness.  Mild hyperkeratotic tissue submetatarsal 4 right foot without any underlying ulceration drainage or signs of infection. No pain with calf compression, swelling, warmth, erythema.    Assessment and Plan:  Status post right foot surgery, nonunion  -Treatment options discussed including all alternatives, risks, and complications -Repeat x-rays obtained reviewed.  3 views were obtained.  Compared to prior x-rays there does appear to be some displacement of the distal metatarsal.  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  Fracture Is less than 1 cm. -Still waiting bone stimulator.  I will personally contact them later today. -She did not get the vitamin D level checked and she is to get this done. -Try to avoid as much narcotics were to increase gabapentin to 3 times a day.  If she is only taking the pain medication at nighttime discussed extra strength Tylenol during the day as  well.  Hopefully we can try to decrease amount of narcotic use.  Return in about 4 weeks (around 11/12/2021). Repeat x-ray   Trula Slade DPM  *I personally contacted the [employer] representative.  She does not have record of the order in and I personally faxed it previously.  This was refaxed on October 16, 2021.

## 2021-10-22 ENCOUNTER — Other Ambulatory Visit: Payer: Self-pay | Admitting: Podiatry

## 2021-10-23 ENCOUNTER — Encounter: Payer: Self-pay | Admitting: Podiatry

## 2021-10-23 ENCOUNTER — Other Ambulatory Visit: Payer: Self-pay | Admitting: Podiatry

## 2021-10-23 MED ORDER — HYDROCODONE-ACETAMINOPHEN 5-325 MG PO TABS: 1.0000 | ORAL_TABLET | Freq: Three times a day (TID) | ORAL | 0 refills | Status: DC | PRN

## 2021-10-23 NOTE — Telephone Encounter (Signed)
Called no answer, left message for a return call back.

## 2021-10-24 ENCOUNTER — Telehealth: Payer: Self-pay | Admitting: *Deleted

## 2021-10-24 NOTE — Telephone Encounter (Signed)
error 

## 2021-10-27 ENCOUNTER — Other Ambulatory Visit: Payer: Self-pay | Admitting: Podiatry

## 2021-10-29 ENCOUNTER — Other Ambulatory Visit: Payer: Self-pay | Admitting: Podiatry

## 2021-10-29 ENCOUNTER — Telehealth: Payer: Self-pay

## 2021-10-29 ENCOUNTER — Telehealth: Payer: Self-pay | Admitting: *Deleted

## 2021-10-29 MED ORDER — HYDROCODONE-ACETAMINOPHEN 5-325 MG PO TABS: 1.0000 | ORAL_TABLET | Freq: Three times a day (TID) | ORAL | 0 refills | Status: DC | PRN

## 2021-10-29 NOTE — Telephone Encounter (Signed)
Faxed office notes, demographics, xrays to [employer] per their request 10/29/21,confirmation received.

## 2021-10-29 NOTE — Telephone Encounter (Signed)
traMADol HCl '50MG'$  tablets  PA started on 10/29/21.  10/29/21-Prior Authorization is not required at this time. Pharmacy needs to submit override codes for Drug Utilization Review.

## 2021-10-30 ENCOUNTER — Encounter: Payer: Self-pay | Admitting: Podiatry

## 2021-11-03 ENCOUNTER — Other Ambulatory Visit: Payer: Self-pay | Admitting: Podiatry

## 2021-11-04 ENCOUNTER — Encounter: Payer: Self-pay | Admitting: Podiatry

## 2021-11-05 ENCOUNTER — Other Ambulatory Visit: Payer: Self-pay | Admitting: Podiatry

## 2021-11-06 ENCOUNTER — Other Ambulatory Visit: Payer: Self-pay | Admitting: Podiatry

## 2021-11-06 MED ORDER — HYDROCODONE-ACETAMINOPHEN 5-325 MG PO TABS
1.0000 | ORAL_TABLET | Freq: Three times a day (TID) | ORAL | 0 refills | Status: DC | PRN
Start: 2021-11-06 — End: 2021-11-14

## 2021-11-13 ENCOUNTER — Other Ambulatory Visit: Payer: Self-pay | Admitting: Podiatry

## 2021-11-14 ENCOUNTER — Other Ambulatory Visit: Payer: Self-pay | Admitting: Podiatry

## 2021-11-14 MED ORDER — HYDROCODONE-ACETAMINOPHEN 5-325 MG PO TABS: 1.0000 | ORAL_TABLET | Freq: Three times a day (TID) | ORAL | 0 refills | Status: DC | PRN

## 2021-11-16 ENCOUNTER — Ambulatory Visit
Admission: RE | Admit: 2021-11-16 | Discharge: 2021-11-16 | Disposition: A | Discharge status: Home / Self Care | Payer: Medicaid Other | Source: Ambulatory Visit | Attending: Podiatry | Admitting: Podiatry

## 2021-11-16 DIAGNOSIS — S92314K Nondisplaced fracture of first metatarsal bone, right foot, subsequent encounter for fracture with nonunion: Secondary | ICD-10-CM

## 2021-11-19 ENCOUNTER — Other Ambulatory Visit: Payer: Self-pay | Admitting: Podiatry

## 2021-11-19 ENCOUNTER — Encounter: Payer: Self-pay | Admitting: Podiatry

## 2021-11-19 MED ORDER — HYDROCODONE-ACETAMINOPHEN 5-325 MG PO TABS
1.0000 | ORAL_TABLET | Freq: Three times a day (TID) | ORAL | 0 refills | Status: DC | PRN
Start: 2021-11-19 — End: 2021-11-26

## 2021-11-22 ENCOUNTER — Ambulatory Visit: Payer: Medicaid Other | Admitting: Podiatry

## 2021-11-25 ENCOUNTER — Other Ambulatory Visit: Payer: Self-pay | Admitting: Podiatry

## 2021-11-25 ENCOUNTER — Encounter: Payer: Self-pay | Admitting: Podiatry

## 2021-11-26 ENCOUNTER — Encounter: Payer: Self-pay | Admitting: Podiatry

## 2021-11-26 ENCOUNTER — Other Ambulatory Visit: Payer: Self-pay | Admitting: Podiatry

## 2021-11-26 MED ORDER — HYDROCODONE-ACETAMINOPHEN 5-325 MG PO TABS: 1.0000 | ORAL_TABLET | Freq: Three times a day (TID) | ORAL | 0 refills | Status: DC | PRN

## 2021-11-29 ENCOUNTER — Other Ambulatory Visit: Payer: Self-pay | Admitting: Podiatry

## 2021-11-29 ENCOUNTER — Encounter: Payer: Self-pay | Admitting: Podiatry

## 2021-11-29 ENCOUNTER — Ambulatory Visit (INDEPENDENT_AMBULATORY_CARE_PROVIDER_SITE_OTHER): Payer: Medicaid Other

## 2021-11-29 ENCOUNTER — Ambulatory Visit (INDEPENDENT_AMBULATORY_CARE_PROVIDER_SITE_OTHER): Payer: Medicaid Other | Admitting: Podiatry

## 2021-11-29 DIAGNOSIS — M21611 Bunion of right foot: Secondary | ICD-10-CM | POA: Diagnosis not present

## 2021-11-29 DIAGNOSIS — S92314K Nondisplaced fracture of first metatarsal bone, right foot, subsequent encounter for fracture with nonunion: Secondary | ICD-10-CM

## 2021-11-29 DIAGNOSIS — M216X1 Other acquired deformities of right foot: Secondary | ICD-10-CM | POA: Diagnosis not present

## 2021-11-29 NOTE — Patient Instructions (Signed)

## 2021-11-29 NOTE — Telephone Encounter (Signed)
[name], I put in for a knee scooter if you can please follow up on this. Thanks.

## 2021-11-29 NOTE — Progress Notes (Signed)
Knee scooter ordered

## 2021-11-30 ENCOUNTER — Other Ambulatory Visit: Payer: Self-pay | Admitting: Podiatry

## 2021-11-30 LAB — COMPLETE METABOLIC PANEL WITH GFR
AG Ratio: 1.5 (calc) (ref 1.0–2.5)
ALT: 16 U/L (ref 6–29)
AST: 13 U/L (ref 10–30)
Albumin: 4.4 g/dL (ref 3.6–5.1)
Alkaline phosphatase (APISO): 59 U/L (ref 31–125)
BUN: 10 mg/dL (ref 7–25)
CO2: 24 mmol/L (ref 20–32)
Calcium: 9.6 mg/dL (ref 8.6–10.2)
Chloride: 107 mmol/L (ref 98–110)
Creat: 0.88 mg/dL (ref 0.50–0.97)
Globulin: 2.9 g/dL (calc) (ref 1.9–3.7)
Glucose, Bld: 115 mg/dL — ABNORMAL HIGH (ref 65–99)
Potassium: 4.1 mmol/L (ref 3.5–5.3)
Sodium: 139 mmol/L (ref 135–146)
Total Bilirubin: 0.4 mg/dL (ref 0.2–1.2)
Total Protein: 7.3 g/dL (ref 6.1–8.1)
eGFR: 89 mL/min/{1.73_m2} (ref >=60)

## 2021-11-30 LAB — CBC WITH DIFFERENTIAL/PLATELET
Absolute Monocytes: 590 cells/uL (ref 200–950)
Basophils Absolute: 59 cells/uL (ref 0–200)
Basophils Relative: 0.5 %
Eosinophils Absolute: 142 cells/uL (ref 15–500)
Eosinophils Relative: 1.2 %
HCT: 45.5 % — ABNORMAL HIGH (ref 35.0–45.0)
Hemoglobin: 15.5 g/dL (ref 11.7–15.5)
Lymphs Abs: 2466 cells/uL (ref 850–3900)
MCH: 33.1 pg — ABNORMAL HIGH (ref 27.0–33.0)
MCHC: 34.1 g/dL (ref 32.0–36.0)
MCV: 97.2 fL (ref 80.0–100.0)
MPV: 10 fL (ref 7.5–12.5)
Monocytes Relative: 5 %
Neutro Abs: 8543 cells/uL — ABNORMAL HIGH (ref 1500–7800)
Neutrophils Relative %: 72.4 %
Platelets: 253 10*3/uL (ref 140–400)
RBC: 4.68 10*6/uL (ref 3.80–5.10)
RDW: 11.8 % (ref 11.0–15.0)
Total Lymphocyte: 20.9 %
WBC: 11.8 10*3/uL — ABNORMAL HIGH (ref 3.8–10.8)

## 2021-11-30 LAB — VITAMIN D 25 HYDROXY (VIT D DEFICIENCY, FRACTURES): Vit D, 25-Hydroxy: 55 ng/mL (ref 30–100)

## 2021-12-01 ENCOUNTER — Other Ambulatory Visit: Payer: Self-pay | Admitting: Podiatry

## 2021-12-02 ENCOUNTER — Other Ambulatory Visit: Payer: Self-pay | Admitting: Podiatry

## 2021-12-02 ENCOUNTER — Encounter: Payer: Self-pay | Admitting: Podiatry

## 2021-12-03 ENCOUNTER — Other Ambulatory Visit: Payer: Self-pay | Admitting: Podiatry

## 2021-12-03 MED ORDER — HYDROCODONE-ACETAMINOPHEN 5-325 MG PO TABS: 1.0000 | ORAL_TABLET | Freq: Three times a day (TID) | ORAL | 0 refills | Status: DC | PRN

## 2021-12-03 NOTE — Progress Notes (Signed)
Subjective: Chief Complaint  Patient presents with   Routine Post Op    DOS 04/18/2021 RT FOOT SURGICAL CORRECTION OF BUNION (1ST METATARSAL OSTEOTOMY) AKIN OSTEOTOMY, CONDYLEOTOMY OF METATARSAL , EXCISION SKIN LESION.   Pain is worst. Patient is taking Vicodin and it helps the pain. Swelling increase. Throbbing sensation. No injuries to right foot. Taking Gabapentin and anti-inflammatory. Using the bone stimulator once a day since Monday.     Monique [name] is a 31 y.o. is seen today in office s/p right foot bunionectomy, condylectomy of the fourth metatarsal with excision of skin lesion preformed on 04/18/2021.  States that she still in discomfort.  She presents today to further discuss the CT scan.  She said the hydrocodone has not been helping much.  States the callus on the right foot is coming back causing discomfort.  Objective: General: No acute distress, AAOx3  DP/PT pulses palpable 2/4, CRT < 3 sec to all digits.  Protective sensation intact. Motor function intact.  Right foot: Incision is well coapted without any evidence of dehiscence and scar is formed.  Dorsal bony prominence present on the first metatarsal.  There is mild edema to the area there is no erythema or warmth.  Hyperkeratotic lesion submetatarsal 4.  This is less than what it was prior to surgery but has come back causing discomfort. No pain with calf compression, swelling, warmth, erythema.    Assessment and Plan:  Status post right foot surgery, nonunion; hyperkeratotic lesion  -Treatment options discussed including all alternatives, risks, and complications -CT scan results were discussed with the patient.  We discussed both conservative as well as surgical intervention.  Given her ongoing pain I do recommend we return to the operating room for ORIF of the nonunion of the first metatarsal.  Discussed with her also foot and osteotomy of the fourth metatarsal.  After discussion she wants to proceed. -The incision  placement as well as the postoperative course was discussed with the patient. I discussed risks of the surgery which include, but not limited to, infection, bleeding, pain, swelling, need for further surgery, delayed or nonhealing, painful or ugly scar, numbness or sensation changes, over/under correction, recurrence, transfer lesions, further deformity, hardware failure, DVT/PE, loss of toe/foot. Patient understands these risks and wishes to proceed with surgery. The surgical consent was reviewed with the patient all 3 pages were signed. No promises or guarantees were given to the outcome of the procedure. All questions were answered to the best of my ability. Before the surgery the patient was encouraged to call the office if there is any further questions. The surgery will be performed at the [facility] on an outpatient basis. -Blood work ordered  Trula Slade DPM

## 2021-12-04 ENCOUNTER — Telehealth: Payer: Self-pay

## 2021-12-04 NOTE — Telephone Encounter (Signed)
Order for a wheelchair was placed with [facility] for surgery on 01/02/2022.

## 2021-12-05 ENCOUNTER — Other Ambulatory Visit: Payer: Self-pay | Admitting: Podiatry

## 2021-12-05 DIAGNOSIS — S92314K Nondisplaced fracture of first metatarsal bone, right foot, subsequent encounter for fracture with nonunion: Secondary | ICD-10-CM

## 2021-12-05 DIAGNOSIS — M21611 Bunion of right foot: Secondary | ICD-10-CM

## 2021-12-07 ENCOUNTER — Encounter: Payer: Self-pay | Admitting: Podiatry

## 2021-12-08 ENCOUNTER — Other Ambulatory Visit: Payer: Self-pay | Admitting: Podiatry

## 2021-12-08 MED ORDER — HYDROCODONE-ACETAMINOPHEN 5-325 MG PO TABS: 1.0000 | ORAL_TABLET | Freq: Three times a day (TID) | ORAL | 0 refills | Status: DC | PRN

## 2021-12-12 ENCOUNTER — Telehealth: Payer: Self-pay | Admitting: Urology

## 2021-12-12 NOTE — Telephone Encounter (Signed)
DOS - 01/02/22  METATARSAL OSTEOTOMY 4TH RIGHT --- 92004 ORIF RIGHT --- 15930  PER [employer] WEBSITE FOR CPT CODE 12379 NO PRIOR AUTH IS REQUIRED. FOR CPT CODE 90940 NEED TO CONTACT [facility]. SPOKE [place] AND CPT CODE 00505 HAS BEEN APPROVED, AUTH # [id number], GOOD FROM 01/02/22 - 03/02/22.  REF # [id number]

## 2021-12-13 ENCOUNTER — Other Ambulatory Visit: Payer: Self-pay | Admitting: Podiatry

## 2021-12-14 ENCOUNTER — Other Ambulatory Visit: Payer: Self-pay | Admitting: Podiatry

## 2021-12-14 MED ORDER — HYDROCODONE-ACETAMINOPHEN 5-325 MG PO TABS: 1.0000 | ORAL_TABLET | Freq: Three times a day (TID) | ORAL | 0 refills | Status: DC | PRN

## 2021-12-18 ENCOUNTER — Encounter: Payer: Self-pay | Admitting: Podiatry

## 2021-12-18 ENCOUNTER — Encounter (HOSPITAL_BASED_OUTPATIENT_CLINIC_OR_DEPARTMENT_OTHER): Payer: Self-pay | Admitting: Podiatry

## 2021-12-18 NOTE — Progress Notes (Signed)
Spoke w/ via phone for pre-op interview--- Monique [name] needs dos----UPT, surgeon orders pending.               [name] results------ COVID test -----patient states asymptomatic no test needed Arrive at -------1517 NPO after MN NO Solid Food.  Clear liquids from MN until---0915 Med rec completed Medications to take morning of surgery ----- Gabapentin Diabetic medication ----- Patient instructed no nail polish to be worn day of surgery Patient instructed to bring photo id and insurance card day of surgery Patient aware to have Driver (ride ) / caregiver Husband Monique [name]    for 24 hours after surgery  Patient Special Instructions ----- Pre-Op special Istructions ----- Patient verbalized understanding of instructions that were given at this phone interview. Patient denies shortness of breath, chest pain, fever, cough at this phone interview.

## 2021-12-19 ENCOUNTER — Other Ambulatory Visit: Payer: Self-pay | Admitting: Podiatry

## 2021-12-19 ENCOUNTER — Encounter: Payer: Self-pay | Admitting: Podiatry

## 2021-12-19 MED ORDER — OXYCODONE-ACETAMINOPHEN 5-325 MG PO TABS: 1.0000 | ORAL_TABLET | ORAL | 0 refills | Status: DC | PRN

## 2021-12-20 ENCOUNTER — Telehealth: Payer: Self-pay | Admitting: Podiatry

## 2021-12-20 ENCOUNTER — Other Ambulatory Visit: Payer: Self-pay | Admitting: Podiatry

## 2021-12-20 MED ORDER — HYDROCODONE-ACETAMINOPHEN 5-325 MG PO TABS: 1.0000 | ORAL_TABLET | Freq: Four times a day (QID) | ORAL | 0 refills | Status: DC | PRN

## 2021-12-20 NOTE — Telephone Encounter (Signed)
Not the pharmacy but Medicaid that has the final say, will not overide pain medicines

## 2021-12-20 NOTE — Telephone Encounter (Addendum)
Patient is calling because her pain medicine was not approved because it has to be signed by regular physician(Dr Wagoner), called Medicaid and they said that once a doctor has been locked in for pain meds, can not override according to her medicaid guidelines. Called the patient to inform, no answer, voice mailbox.  Spoke with the patient and she said that they finally approved the hydrocodone -ace. Both providers can approve the oxycodone refills moving forward. She picked up yesterday.

## 2021-12-20 NOTE — Telephone Encounter (Signed)
Pt states she is trying to pick up medication from [facility] in [place] and she is being told that since Dr. Jacqualyn Posey didn't send the RX they cant fill it unless she changes her doctor with Medicaid. She is not understanding this logic.  Dr Posey Pronto sent medication today for the same medication but she is unable to pick this up.  HYDROcodone-acetaminophen [facility]) 5-325 MG tablet  [place], [place] - [address]  [address] [place], [place] [address]  Phone:  [phone]  Fax:  [phone]   Please advise

## 2021-12-21 NOTE — Telephone Encounter (Signed)
Patient has received her medication yesterday.

## 2021-12-21 NOTE — Telephone Encounter (Signed)
Medication has been approved by insurance, received yesterday.

## 2021-12-25 ENCOUNTER — Other Ambulatory Visit: Payer: Self-pay | Admitting: Podiatry

## 2021-12-26 ENCOUNTER — Other Ambulatory Visit: Payer: Self-pay | Admitting: Podiatry

## 2021-12-27 ENCOUNTER — Other Ambulatory Visit: Payer: Self-pay | Admitting: Podiatry

## 2021-12-27 ENCOUNTER — Encounter: Payer: Self-pay | Admitting: Podiatry

## 2021-12-27 MED ORDER — CELECOXIB 100 MG PO CAPS: 100.0000 mg | ORAL_CAPSULE | Freq: Two times a day (BID) | ORAL | 0 refills | Status: DC

## 2021-12-27 MED ORDER — HYDROCODONE-ACETAMINOPHEN 5-325 MG PO TABS: 1.0000 | ORAL_TABLET | Freq: Four times a day (QID) | ORAL | 0 refills | Status: DC | PRN

## 2021-12-27 MED ORDER — GABAPENTIN 100 MG PO CAPS: 100.0000 mg | ORAL_CAPSULE | Freq: Three times a day (TID) | ORAL | 0 refills | Status: DC

## 2021-12-31 ENCOUNTER — Encounter: Payer: Self-pay | Admitting: Podiatry

## 2021-12-31 ENCOUNTER — Other Ambulatory Visit: Payer: Self-pay | Admitting: Podiatry

## 2022-01-01 ENCOUNTER — Other Ambulatory Visit: Payer: Self-pay | Admitting: Podiatry

## 2022-01-01 MED ORDER — CEPHALEXIN 500 MG PO CAPS: 500.0000 mg | ORAL_CAPSULE | Freq: Three times a day (TID) | ORAL | 0 refills | Status: AC

## 2022-01-01 MED ORDER — OXYCODONE-ACETAMINOPHEN 5-325 MG PO TABS: 1.0000 | ORAL_TABLET | Freq: Four times a day (QID) | ORAL | 0 refills | Status: DC | PRN

## 2022-01-01 MED ORDER — PROMETHAZINE HCL 25 MG PO TABS: 25.0000 mg | ORAL_TABLET | Freq: Three times a day (TID) | ORAL | 0 refills | Status: AC | PRN

## 2022-01-01 NOTE — Progress Notes (Signed)
H & P/medical clearance received from dr  Ferdinand Lango dated 12-31-2021 for 01-02-2022 surgery placed on patient chart.

## 2022-01-02 ENCOUNTER — Encounter: Payer: Self-pay | Admitting: Podiatry

## 2022-01-02 ENCOUNTER — Other Ambulatory Visit: Payer: Self-pay

## 2022-01-02 ENCOUNTER — Ambulatory Visit (HOSPITAL_BASED_OUTPATIENT_CLINIC_OR_DEPARTMENT_OTHER): Payer: Medicaid Other | Admitting: Anesthesiology

## 2022-01-02 ENCOUNTER — Encounter (HOSPITAL_BASED_OUTPATIENT_CLINIC_OR_DEPARTMENT_OTHER): Payer: Self-pay | Admitting: Podiatry

## 2022-01-02 ENCOUNTER — Ambulatory Visit (HOSPITAL_BASED_OUTPATIENT_CLINIC_OR_DEPARTMENT_OTHER)
Admission: RE | Admit: 2022-01-02 | Discharge: 2022-01-02 | Disposition: A | Discharge status: Home / Self Care | Payer: Medicaid Other | Attending: Podiatry | Admitting: Podiatry

## 2022-01-02 ENCOUNTER — Encounter (HOSPITAL_BASED_OUTPATIENT_CLINIC_OR_DEPARTMENT_OTHER)
Admission: RE | Disposition: A | Discharge status: Home / Self Care | Payer: Self-pay | Source: Home / Self Care | Attending: Podiatry

## 2022-01-02 ENCOUNTER — Ambulatory Visit (HOSPITAL_BASED_OUTPATIENT_CLINIC_OR_DEPARTMENT_OTHER): Payer: Medicaid Other

## 2022-01-02 DIAGNOSIS — X58XXXA Exposure to other specified factors, initial encounter: Secondary | ICD-10-CM | POA: Diagnosis not present

## 2022-01-02 DIAGNOSIS — S92301A Fracture of unspecified metatarsal bone(s), right foot, initial encounter for closed fracture: Secondary | ICD-10-CM | POA: Diagnosis not present

## 2022-01-02 DIAGNOSIS — M1990 Unspecified osteoarthritis, unspecified site: Secondary | ICD-10-CM

## 2022-01-02 DIAGNOSIS — Z01818 Encounter for other preprocedural examination: Secondary | ICD-10-CM

## 2022-01-02 DIAGNOSIS — M21271 Flexion deformity, right ankle and toes: Secondary | ICD-10-CM | POA: Insufficient documentation

## 2022-01-02 DIAGNOSIS — S92311A Displaced fracture of first metatarsal bone, right foot, initial encounter for closed fracture: Secondary | ICD-10-CM | POA: Insufficient documentation

## 2022-01-02 DIAGNOSIS — F17200 Nicotine dependence, unspecified, uncomplicated: Secondary | ICD-10-CM | POA: Diagnosis not present

## 2022-01-02 DIAGNOSIS — Z9889 Other specified postprocedural states: Secondary | ICD-10-CM | POA: Diagnosis not present

## 2022-01-02 DIAGNOSIS — M216X1 Other acquired deformities of right foot: Secondary | ICD-10-CM | POA: Diagnosis not present

## 2022-01-02 DIAGNOSIS — S92314K Nondisplaced fracture of first metatarsal bone, right foot, subsequent encounter for fracture with nonunion: Secondary | ICD-10-CM

## 2022-01-02 HISTORY — PX: ORIF TOE FRACTURE: SHX5032

## 2022-01-02 HISTORY — PX: METATARSAL OSTEOTOMY: SHX1641

## 2022-01-02 LAB — POCT PREGNANCY, URINE: Preg Test, Ur: NEGATIVE

## 2022-01-02 SURGERY — OSTEOTOMY, METATARSAL BONE
Anesthesia: Regional | Site: Toe | Laterality: Right

## 2022-01-02 MED ORDER — LACTATED RINGERS IV SOLN: INTRAVENOUS | Status: DC

## 2022-01-02 MED ORDER — BUPIVACAINE-EPINEPHRINE (PF) 0.5% -1:200000 IJ SOLN
INTRAMUSCULAR | Status: DC | PRN
  Administered 2022-01-02: 30 mL via PERINEURAL

## 2022-01-02 MED ORDER — OXYCODONE HCL 5 MG/5ML PO SOLN: 5.0000 mg | Freq: Once | ORAL | Status: DC | PRN

## 2022-01-02 MED ORDER — PROPOFOL 500 MG/50ML IV EMUL
INTRAVENOUS | Status: DC | PRN
  Administered 2022-01-02: 200 ug/kg/min via INTRAVENOUS

## 2022-01-02 MED ORDER — LIDOCAINE HCL (CARDIAC) PF 100 MG/5ML IV SOSY
PREFILLED_SYRINGE | INTRAVENOUS | Status: DC | PRN
  Administered 2022-01-02: 50 mg via INTRAVENOUS

## 2022-01-02 MED ORDER — MIDAZOLAM HCL 2 MG/2ML IJ SOLN
0.5000 mg | Freq: Once | INTRAMUSCULAR | Status: AC
  Administered 2022-01-02: 2 mg via INTRAVENOUS

## 2022-01-02 MED ORDER — MIDAZOLAM HCL 5 MG/5ML IJ SOLN
INTRAMUSCULAR | Status: DC | PRN
  Administered 2022-01-02: 2 mg via INTRAVENOUS

## 2022-01-02 MED ORDER — ONDANSETRON HCL 4 MG/2ML IJ SOLN
INTRAMUSCULAR | Status: DC | PRN
  Administered 2022-01-02: 4 mg via INTRAVENOUS

## 2022-01-02 MED ORDER — OXYCODONE HCL 5 MG PO TABS: 5.0000 mg | ORAL_TABLET | Freq: Once | ORAL | Status: DC | PRN

## 2022-01-02 MED ORDER — FENTANYL CITRATE (PF) 100 MCG/2ML IJ SOLN
25.0000 ug | Freq: Once | INTRAMUSCULAR | Status: AC
  Administered 2022-01-02: 100 ug via INTRAVENOUS

## 2022-01-02 MED ORDER — AMISULPRIDE (ANTIEMETIC) 5 MG/2ML IV SOLN: 10.0000 mg | Freq: Once | INTRAVENOUS | Status: DC | PRN

## 2022-01-02 MED ORDER — KETOROLAC TROMETHAMINE 30 MG/ML IJ SOLN: 30.0000 mg | Freq: Once | INTRAMUSCULAR | Status: DC | PRN

## 2022-01-02 MED ORDER — CEFAZOLIN SODIUM-DEXTROSE 2-4 GM/100ML-% IV SOLN
2.0000 g | INTRAVENOUS | Status: AC
  Administered 2022-01-02: 2 g via INTRAVENOUS

## 2022-01-02 MED ORDER — PROPOFOL 1000 MG/100ML IV EMUL
INTRAVENOUS | Status: AC
  Filled 2022-01-02: qty 100

## 2022-01-02 MED ORDER — CHLORHEXIDINE GLUCONATE CLOTH 2 % EX PADS: 6.0000 | MEDICATED_PAD | Freq: Once | CUTANEOUS | Status: DC

## 2022-01-02 MED ORDER — DEXMEDETOMIDINE HCL IN NACL 400 MCG/100ML IV SOLN
INTRAVENOUS | Status: DC | PRN
  Administered 2022-01-02 (×3): 4 ug via INTRAVENOUS

## 2022-01-02 MED ORDER — ACETAMINOPHEN 500 MG PO TABS
ORAL_TABLET | ORAL | Status: AC
  Filled 2022-01-02: qty 2

## 2022-01-02 MED ORDER — 0.9 % SODIUM CHLORIDE (POUR BTL) OPTIME
TOPICAL | Status: DC | PRN
  Administered 2022-01-02: 500 mL

## 2022-01-02 MED ORDER — LIDOCAINE HCL (PF) 2 % IJ SOLN
INTRAMUSCULAR | Status: AC
  Filled 2022-01-02: qty 5

## 2022-01-02 MED ORDER — FENTANYL CITRATE (PF) 100 MCG/2ML IJ SOLN
INTRAMUSCULAR | Status: AC
  Filled 2022-01-02: qty 2

## 2022-01-02 MED ORDER — ACETAMINOPHEN 500 MG PO TABS
1000.0000 mg | ORAL_TABLET | Freq: Once | ORAL | Status: AC
  Administered 2022-01-02: 1000 mg via ORAL

## 2022-01-02 MED ORDER — PROPOFOL 10 MG/ML IV BOLUS
INTRAVENOUS | Status: AC
  Filled 2022-01-02: qty 20

## 2022-01-02 MED ORDER — MIDAZOLAM HCL 2 MG/2ML IJ SOLN
INTRAMUSCULAR | Status: AC
  Filled 2022-01-02: qty 2

## 2022-01-02 MED ORDER — CEFAZOLIN SODIUM-DEXTROSE 2-4 GM/100ML-% IV SOLN
INTRAVENOUS | Status: AC
  Filled 2022-01-02: qty 100

## 2022-01-02 MED ORDER — FENTANYL CITRATE (PF) 100 MCG/2ML IJ SOLN: 25.0000 ug | INTRAMUSCULAR | Status: DC | PRN

## 2022-01-02 MED ORDER — PROPOFOL 10 MG/ML IV BOLUS
INTRAVENOUS | Status: DC | PRN
  Administered 2022-01-02: 30 mg via INTRAVENOUS

## 2022-01-02 MED ORDER — DEXMEDETOMIDINE HCL IN NACL 80 MCG/20ML IV SOLN
INTRAVENOUS | Status: AC
  Filled 2022-01-02: qty 20

## 2022-01-02 MED ORDER — PROMETHAZINE HCL 25 MG/ML IJ SOLN: 6.2500 mg | INTRAMUSCULAR | Status: DC | PRN

## 2022-01-02 SURGICAL SUPPLY — 94 items
BIT DRILL 2.0 (BIT) ×1
BIT DRILL SRG 2XPILT 2.7XSCR (BIT) IMPLANT
BIT DRL SRG 2XPILT 2.7XSCR (BIT) ×1
BLADE AVERAGE 25X9 (BLADE) ×1 IMPLANT
BLADE SURG 15 STRL LF DISP TIS (BLADE) ×2 IMPLANT
BLADE SURG 15 STRL SS (BLADE) ×3
BNDG CMPR 75X21 PLY HI ABS (MISCELLANEOUS)
BNDG CMPR 9X4 STRL LF SNTH (GAUZE/BANDAGES/DRESSINGS) ×1
BNDG ELASTIC 3X5.8 VLCR STR LF (GAUZE/BANDAGES/DRESSINGS) ×1 IMPLANT
BNDG ELASTIC 4X5.8 VLCR STR LF (GAUZE/BANDAGES/DRESSINGS) ×2 IMPLANT
BNDG ELASTIC 6X5.8 VLCR STR LF (GAUZE/BANDAGES/DRESSINGS) IMPLANT
BNDG ESMARK 4X9 LF (GAUZE/BANDAGES/DRESSINGS) ×1 IMPLANT
BNDG GAUZE DERMACEA FLUFF 4 (GAUZE/BANDAGES/DRESSINGS) ×1 IMPLANT
BNDG GZE DERMACEA 4 6PLY (GAUZE/BANDAGES/DRESSINGS) ×2
COUNTERSINK CANN 3/4 (ORTHOPEDIC DISPOSABLE SUPPLIES) ×1
COVER BACK TABLE 60X90IN (DRAPES) ×1 IMPLANT
COVER MAYO STAND STRL (DRAPES) ×1 IMPLANT
CUFF TOURN SGL QUICK 18X4 (TOURNIQUET CUFF) IMPLANT
DRAPE C-ARM 35X43 STRL (DRAPES) ×1 IMPLANT
DRAPE C-ARMOR (DRAPES) IMPLANT
DRAPE EXTREMITY T 121X128X90 (DISPOSABLE) ×1 IMPLANT
DRAPE IMP U-DRAPE 54X76 (DRAPES) ×1 IMPLANT
DRAPE OEC MINIVIEW 54X84 (DRAPES) IMPLANT
DRAPE SURG 17X23 STRL (DRAPES) ×1 IMPLANT
DRSG ADAPTIC 3X8 NADH LF (GAUZE/BANDAGES/DRESSINGS) IMPLANT
DRSG EMULSION OIL 3X3 NADH (GAUZE/BANDAGES/DRESSINGS) ×1 IMPLANT
DURAPREP 26ML APPLICATOR (WOUND CARE) ×1 IMPLANT
ELECT REM PT RETURN 9FT ADLT (ELECTROSURGICAL) ×1
ELECTRODE REM PT RTRN 9FT ADLT (ELECTROSURGICAL) ×1 IMPLANT
GAUZE 4X4 16PLY ~~LOC~~+RFID DBL (SPONGE) ×1 IMPLANT
GAUZE SPONGE 4X4 12PLY STRL (GAUZE/BANDAGES/DRESSINGS) ×1 IMPLANT
GAUZE STRETCH 2X75IN STRL (MISCELLANEOUS) ×1 IMPLANT
GLOVE BIO SURGEON STRL SZ7.5 (GLOVE) ×2 IMPLANT
GLOVE BIO SURGEON STRL SZ8 (GLOVE) IMPLANT
GLOVE BIOGEL PI IND STRL 8 (GLOVE) IMPLANT
GOWN STRL REUS W/TWL LRG LVL3 (GOWN DISPOSABLE) ×1 IMPLANT
K-WIRE .045X4 (WIRE) ×1
K-WIRE .062 (WIRE) ×1
K-WIRE FX7.25X.062XNS KRSH (WIRE) ×1
KIT INSTRUMENT MINI BUNION (INSTRUMENTS) IMPLANT
KIT TURNOVER CYSTO (KITS) ×1 IMPLANT
KWIRE .045X4 (WIRE) IMPLANT
KWIRE FX7.25X.062XNS KRSH (WIRE) IMPLANT
NDL HYPO 25X1 1.5 SAFETY (NEEDLE) ×1 IMPLANT
NDL SAFETY ECLIP 18X1.5 (MISCELLANEOUS) IMPLANT
NEEDLE HYPO 25X1 1.5 SAFETY (NEEDLE) ×1 IMPLANT
NS IRRIG 1000ML POUR BTL (IV SOLUTION) IMPLANT
NS IRRIG 500ML POUR BTL (IV SOLUTION) IMPLANT
OSTEOMED 0.62" KWIRE IMPLANT
OSTEOMED 2.0MM DRILL IMPLANT
OSTEOMED COUNTERSINK IMPLANT
OSTEOMED PLATE HOLDING TAK IMPLANT
PACK BASIN DAY SURGERY FS (CUSTOM PROCEDURE TRAY) ×1 IMPLANT
PACK ORTHO EXTREMITY (CUSTOM PROCEDURE TRAY) ×1 IMPLANT
PAD CAST 4YDX4 CTTN HI CHSV (CAST SUPPLIES) IMPLANT
PADDING CAST ABS COTTON 4X4 ST (CAST SUPPLIES) ×2 IMPLANT
PADDING CAST COTTON 4X4 STRL (CAST SUPPLIES) ×1
PENCIL SMOKE EVACUATOR (MISCELLANEOUS) ×1 IMPLANT
PLATE HOLE LEFT 2.7 (Plate) IMPLANT
PUTTY DBM STAGRAFT PLUS 5CC (Putty) IMPLANT
SCREW COUNTERSINK CANN 3/4 (ORTHOPEDIC DISPOSABLE SUPPLIES) IMPLANT
SCREW LOCK ANG 14X2.7X DISP (Screw) IMPLANT
SCREW LOCK ANG 18X2.7X DISP (Screw) IMPLANT
SCREW LOCK ANG 24X2.7X DISP (Screw) IMPLANT
SCREW LOCK STD 24X2.7XFT SM (Screw) IMPLANT
SCREW LOCKING 2.7X14 (Screw) ×1 IMPLANT
SCREW LOCKING 2.7X18 (Screw) ×1 IMPLANT
SCREW LOCKING 2.7X24 (Screw) ×2 IMPLANT
SCREW SLF DRL 3.0X34 (Screw) IMPLANT
SCREW STANDARD 2.7X18 (Screw) ×1 IMPLANT
SCREW STD 2.7X18 (Screw) IMPLANT
SPLINT FIBERGLASS 4X30 (CAST SUPPLIES) ×1 IMPLANT
STAPLER VISISTAT 35W (STAPLE) IMPLANT
STOCKINETTE 6  STRL (DRAPES) ×1
STOCKINETTE 6 STRL (DRAPES) ×1 IMPLANT
STRIP CLOSURE SKIN 1/2X4 (GAUZE/BANDAGES/DRESSINGS) IMPLANT
SUCTION FRAZIER HANDLE 10FR (MISCELLANEOUS) ×1
SUCTION TUBE FRAZIER 10FR DISP (MISCELLANEOUS) ×1 IMPLANT
SUT MERSILENE 2.0 SH NDLE (SUTURE) ×1 IMPLANT
SUT MERSILENE 3 0 FS 1 (SUTURE) IMPLANT
SUT MNCRL AB 3-0 PS2 18 (SUTURE) ×1 IMPLANT
SUT MNCRL AB 4-0 PS2 18 (SUTURE) ×1 IMPLANT
SUT MON AB 2-0 SH 27 (SUTURE)
SUT MON AB 2-0 SH27 (SUTURE) IMPLANT
SUT MON AB 3-0 SH 27 (SUTURE)
SUT MON AB 3-0 SH27 (SUTURE) IMPLANT
SUT MON AB 4-0 PS1 27 (SUTURE) IMPLANT
SUT MON AB 5-0 PS2 18 (SUTURE) ×1 IMPLANT
SUT PROLENE 3 0 PS 2 (SUTURE) IMPLANT
SUT PROLENE 4 0 PS 2 18 (SUTURE) ×1 IMPLANT
SYR 10ML LL (SYRINGE) IMPLANT
SYR BULB EAR ULCER 3OZ GRN STR (SYRINGE) ×1 IMPLANT
TAK PLATE HOLDING (ORTHOPEDIC DISPOSABLE SUPPLIES) IMPLANT
UNDERPAD 30X36 HEAVY ABSORB (UNDERPADS AND DIAPERS) ×1 IMPLANT

## 2022-01-02 NOTE — Progress Notes (Signed)
Assisted Dr. Roanna Banning with right, popliteal, ultrasound guided block. Side rails up, monitors on throughout procedure. See vital signs in flow sheet. Tolerated Procedure well.

## 2022-01-02 NOTE — Transfer of Care (Signed)
Immediate Anesthesia Transfer of Care Note  Patient: Monique [name]  Procedure(s) Performed: Procedure(s) (LRB): METATARSAL OSTEOTOMY (Right) OPEN REDUCTION INTERNAL FIXATION (ORIF) METATARSAL (TOE) FRACTURE 1ST (Right)  Patient Location: PACU  Anesthesia Type: MAC  Level of Consciousness: awake, alert  and oriented  Airway & Oxygen Therapy: Patient Spontanous Breathing   Post-op Assessment: Report given to PACU RN and Post -op Vital signs reviewed and stable  Post vital signs: Reviewed and stable  Complications: No apparent anesthesia complications  Last Vitals:  Vitals Value Taken Time  BP 97/64 01/02/22 1434  Temp 36.5 C 01/02/22 1434  Pulse 59 01/02/22 1437  Resp 22 01/02/22 1437  SpO2 96 % 01/02/22 1437  Vitals shown include unvalidated device data.  Last Pain:  Vitals:   01/02/22 1200  TempSrc:   PainSc: 0-No pain      Patients Stated Pain Goal: 6 (50/35/46 5681)  Complications: No notable events documented.

## 2022-01-02 NOTE — Anesthesia Postprocedure Evaluation (Signed)
Anesthesia Post Note  Patient: Monique [name]  Procedure(s) Performed: METATARSAL OSTEOTOMY (Right: Toe) OPEN REDUCTION INTERNAL FIXATION (ORIF) METATARSAL (TOE) FRACTURE 1ST (Right: Toe)     Patient location during evaluation: PACU Anesthesia Type: Regional Level of consciousness: awake Pain management: pain level controlled Vital Signs Assessment: post-procedure vital signs reviewed and stable Respiratory status: spontaneous breathing, nonlabored ventilation, respiratory function stable and patient connected to nasal cannula oxygen Cardiovascular status: stable and blood pressure returned to baseline Postop Assessment: no apparent nausea or vomiting Anesthetic complications: no   No notable events documented.  Last Vitals:  Vitals:   01/02/22 1500 01/02/22 1545  BP: 102/60 107/65  Pulse: 60 67  Resp: 14 16  Temp:  36.7 C  SpO2: 94% 95%    Last Pain:  Vitals:   01/02/22 1545  TempSrc:   PainSc: 0-No pain                 Erendira Crabtree P Korde Jeppsen

## 2022-01-02 NOTE — Anesthesia Procedure Notes (Signed)
Procedure Name: MAC Date/Time: 01/02/2022 12:35 PM  Performed by: Mechele Claude, CRNAPre-anesthesia Checklist: Patient identified, Emergency Drugs available, Suction available and Patient being monitored Oxygen Delivery Method: Simple face mask Placement Confirmation: positive ETCO2, CO2 detector and breath sounds checked- equal and bilateral

## 2022-01-02 NOTE — Progress Notes (Signed)
Orthopedic Tech Progress Note Patient Details:  Monique [name] 1990/11/29 [id number]  Patient ID: [name], female   DOB: [birth year], 31 y.o.   MRN: [id number]  [name] 01/02/2022, 2:59 PM Right cam walker applied in [facility] pacu

## 2022-01-02 NOTE — Brief Op Note (Signed)
01/02/2022  2:28 PM  PATIENT:  Monique [name]  31 y.o. female  PRE-OPERATIVE DIAGNOSIS:  plantar flexed metatarsal right foot, fracture metatarsal right foot  POST-OPERATIVE DIAGNOSIS:  plantar flexed metatarsal right foot, fracture metatarsal right foot  PROCEDURE:  Procedure(s) with comments: METATARSAL OSTEOTOMY (Right) - REGIONAL BLOCK OPEN REDUCTION INTERNAL FIXATION (ORIF) METATARSAL (TOE) FRACTURE 1ST (Right) - REGIONAL BLOCK  SURGEON:  Surgeon(s) and Role:    * Trula Slade, DPM - Primary  PHYSICIAN ASSISTANT:   ASSISTANTS: none   ANESTHESIA:   regional and MAC  EBL:  minimal   BLOOD ADMINISTERED:none  DRAINS: none   LOCAL MEDICATIONS USED:  NONE  SPECIMEN:  No Specimen  DISPOSITION OF SPECIMEN:  N/A  COUNTS:  YES  TOURNIQUET:   Total Tourniquet Time Documented: Calf (Right) - 90 minutes Total: Calf (Right) - 90 minutes   DICTATION: .Viviann Spare Dictation  PLAN OF CARE: Discharge home once stable  PATIENT DISPOSITION:  PACU - hemodynamically stable.   Delay start of Pharmacological VTE agent (>24hrs) due to surgical blood loss or risk of bleeding: no

## 2022-01-02 NOTE — H&P (Signed)
   Monique [name]  has presented today for surgery, with the diagnosis of plantar flexed metatarsal right foot, fracture metatarsal right foot.  The various methods of treatment have been discussed with the patient and family. After consideration of risks, benefits and other options for treatment, the patient has consented to  Procedure(s) with comments: METATARSAL OSTEOTOMY (Right) - BLOCK OPEN REDUCTION INTERNAL FIXATION (ORIF) METATARSAL (TOE) FRACTURE 1ST (Right), fourth metatarsal osteoetomy as a surgical intervention.  The patient's history has been reviewed, patient examined, no change in status, stable for surgery.  I have reviewed the patient's chart and labs.  Questions were answered to the patient's satisfaction.    I discussed with her again the importance of smoking cessation to allow for healing.

## 2022-01-02 NOTE — Discharge Instructions (Signed)

## 2022-01-02 NOTE — Anesthesia Preprocedure Evaluation (Addendum)
Anesthesia Evaluation  Patient identified by MRN, date of birth, ID band Patient awake    Reviewed: Allergy & Precautions, NPO status , Patient's Chart, lab work & pertinent test results  Airway Mallampati: II  TM Distance: >3 FB Neck ROM: Full    Dental  (+) Poor Dentition, Missing, Chipped   Pulmonary Current Smoker and Patient abstained from smoking.,    Pulmonary exam normal        Cardiovascular negative cardio ROS Normal cardiovascular exam     Neuro/Psych negative neurological ROS  negative psych ROS   GI/Hepatic negative GI ROS, Neg liver ROS,   Endo/Other  negative endocrine ROS  Renal/GU negative Renal ROS     Musculoskeletal  (+) Arthritis ,   Abdominal   Peds  Hematology negative hematology ROS (+)   Anesthesia Other Findings plantar flexed metatarsal right foot,  fracture metatarsal right foot  Reproductive/Obstetrics hcg negative                            Anesthesia Physical Anesthesia Plan  ASA: 2  Anesthesia Plan: Regional   Post-op Pain Management:    Induction: Intravenous  PONV Risk Score and Plan: 1 and Ondansetron, Dexamethasone, Propofol infusion, Midazolam and Treatment may vary due to age or medical condition  Airway Management Planned: Simple Face Mask  Additional Equipment:   Intra-op Plan:   Post-operative Plan:   Informed Consent: I have reviewed the patients History and Physical, chart, labs and discussed the procedure including the risks, benefits and alternatives for the proposed anesthesia with the patient or authorized representative who has indicated his/her understanding and acceptance.     Dental advisory given  Plan Discussed with: CRNA  Anesthesia Plan Comments:         Anesthesia Quick Evaluation

## 2022-01-02 NOTE — Anesthesia Procedure Notes (Signed)
Anesthesia Regional Block: Popliteal block   Pre-Anesthetic Checklist: , timeout performed,  Correct Patient, Correct Site, Correct Laterality,  Correct Procedure, Correct Position, site marked,  Risks and benefits discussed,  Surgical consent,  Pre-op evaluation,  At surgeon's request and post-op pain management  Laterality: Right  Prep: chloraprep       Needles:  Injection technique: Single-shot  Needle Type: Echogenic Stimulator Needle     Needle Length: 9cm  Needle Gauge: 21     Additional Needles:   Procedures:,,,, ultrasound used (permanent image in chart),,    Narrative:  Start time: 01/02/2022 11:30 AM End time: 01/02/2022 11:40 AM Injection made incrementally with aspirations every 5 mL.  Performed by: Personally  Anesthesiologist: Murvin Natal, MD  Additional Notes: Functioning IV was confirmed and monitors were applied.  A timeout was performed. Sterile prep, hand hygiene and sterile gloves were used. A 39m 21ga Arrow echogenic stimulator needle was used. Negative aspiration and negative test dose prior to incremental administration of local anesthetic. The patient tolerated the procedure well.  Ultrasound guidance: relevent anatomy identified, needle position confirmed, local anesthetic spread visualized around nerve(s), vascular puncture avoided.  Image printed for medical record.

## 2022-01-03 ENCOUNTER — Encounter: Payer: Self-pay | Admitting: Podiatry

## 2022-01-04 ENCOUNTER — Other Ambulatory Visit: Payer: Self-pay | Admitting: Podiatry

## 2022-01-04 ENCOUNTER — Encounter (HOSPITAL_BASED_OUTPATIENT_CLINIC_OR_DEPARTMENT_OTHER): Payer: Self-pay | Admitting: Podiatry

## 2022-01-04 MED ORDER — OXYCODONE-ACETAMINOPHEN 5-325 MG PO TABS: 1.0000 | ORAL_TABLET | Freq: Four times a day (QID) | ORAL | 0 refills | Status: DC | PRN

## 2022-01-07 ENCOUNTER — Other Ambulatory Visit: Payer: Self-pay | Admitting: Podiatry

## 2022-01-07 ENCOUNTER — Ambulatory Visit (INDEPENDENT_AMBULATORY_CARE_PROVIDER_SITE_OTHER): Payer: Medicaid Other | Admitting: Podiatry

## 2022-01-07 ENCOUNTER — Ambulatory Visit (INDEPENDENT_AMBULATORY_CARE_PROVIDER_SITE_OTHER): Payer: Medicaid Other

## 2022-01-07 ENCOUNTER — Encounter: Payer: Medicaid Other | Admitting: Podiatry

## 2022-01-07 DIAGNOSIS — T8140XA Infection following a procedure, unspecified, initial encounter: Secondary | ICD-10-CM | POA: Diagnosis not present

## 2022-01-07 DIAGNOSIS — M21611 Bunion of right foot: Secondary | ICD-10-CM

## 2022-01-07 DIAGNOSIS — M216X1 Other acquired deformities of right foot: Secondary | ICD-10-CM

## 2022-01-07 MED ORDER — OXYCODONE-ACETAMINOPHEN 5-325 MG PO TABS: 1.0000 | ORAL_TABLET | Freq: Four times a day (QID) | ORAL | 0 refills | Status: DC | PRN

## 2022-01-07 NOTE — Progress Notes (Unsigned)
Subjective: Chief Complaint  Patient presents with   Routine Post Op    Right foot bunion - constant pain - sharp pain swelling mainly across the top of the foot. Patient states she is not bearing weight on her foot. Patient she has been having to take pain medication often.    Monique [name] is a 31 y.o. 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  Still having pain but is improving.  She is trying to take the pain medicine every 6 hours but she states when she waits it is hard to catch up to the pain.  She has been nonweightbearing in the cam boot.  Denies any systemic complaints such as fevers, chills, nausea, vomiting. No calf pain, chest pain, shortness of breath.   Objective: General: No acute distress, AAOx3  DP/PT pulses palpable 2/4, CRT < 3 sec to all digits.  Protective sensation intact. Motor function intact.  Right foot: Incision is well coapted without any evidence of dehiscence with sutures intact. There is no surrounding erythema, ascending cellulitis, fluctuance, crepitus, malodor, drainage/purulence. There is moderate edema around the surgical site. There is  pain along the surgical site.  No other areas of discomfort. No other areas of tenderness to bilateral lower extremities.  No other open lesions or pre-ulcerative lesions.  No pain with calf compression, swelling, warmth, erythema.   Assessment and Plan:  Status post right foot surgery, doing well with no complications   -Treatment options discussed including all alternatives, risks, and complications -X-rays were obtained reviewed.  Status post ORIF of the first metatarsal as well as ostium of the fourth metatarsal.  There is bone graft material noted on the first MPJ.  No evidence of acute fracture. -Incision was cleaned.  Antibiotic ointment and dressing applied.  Keep the dressing clean, dry, intact -Remain  nonweightbearing in cam boot -Ice/elevation -Pain medication as needed-refill today -Monitor for any clinical signs or symptoms of infection and DVT/PE and directed to call the office immediately should any occur or go to the ER. -Follow-up as scheduled for possible suture removal or sooner if any problems arise. In the meantime, encouraged to call the office with any questions, concerns, change in symptoms.   Celesta Gentile, DPM

## 2022-01-08 ENCOUNTER — Encounter: Payer: Self-pay | Admitting: Podiatry

## 2022-01-08 NOTE — Op Note (Signed)
PATIENT:  Monique [name]  31 y.o. female   PRE-OPERATIVE DIAGNOSIS:  plantar flexed metatarsal right foot, fracture metatarsal right foot   POST-OPERATIVE DIAGNOSIS:  plantar flexed metatarsal right foot, fracture metatarsal right foot   PROCEDURE:  Procedure(s) with comments: METATARSAL OSTEOTOMY (Right) - REGIONAL BLOCK OPEN REDUCTION INTERNAL FIXATION (ORIF) METATARSAL (TOE) FRACTURE 1ST (Right) - REGIONAL BLOCK   SURGEON:  Surgeon(s) and Role:    * Trula Slade, DPM - Primary   PHYSICIAN ASSISTANT:    ASSISTANTS: none    ANESTHESIA:   regional and MAC   EBL:  minimal    BLOOD ADMINISTERED:none   DRAINS: none    LOCAL MEDICATIONS USED:  NONE   SPECIMEN:  No Specimen   DISPOSITION OF SPECIMEN:  N/A   COUNTS:  YES   TOURNIQUET:   Total Tourniquet Time Documented: Calf (Right) - 90 minutes Total: Calf (Right) - 90 minutes     DICTATION: .Viviann Spare Dictation   PLAN OF CARE: Discharge home once stable   PATIENT DISPOSITION:  PACU - hemodynamically stable.   Delay start of Pharmacological VTE agent (>24hrs) due to surgical blood loss or risk of bleeding: no   Indications for surgery: 31 year old female presents the office after having a bunionectomy performed.  Clinically appear to be a nonunion advanced imaging confirmed this.  Given her ongoing pain discussed with her ORIF with nonunion of the first metatarsal.  She continues to get pain on the area of the skin lesion along the right foot we discussed a floating metatarsal osteotomy.  We discussed all alternatives, risks, complications.  No promises or guarantees given second the procedure all questions were answered the best my ability.  Procedure detail: The patient was both verbally and visually identified by myself, nursing staff, the anesthesia staff preoperatively.  Preoperative showed neurological, the anesthesia staff.  She was then transferred to the operating room via stretcher and placed on the  operating table in supine position.  After adequate plane of anesthesia however the pneumatic calf tourniquet applied on the right calf.  Care was taken to pad all bony structures.  Right lower extremities and scrubbed, prepped, draped in normal sterile fashion.  Timeout was performed.  The right lower extremity exsanguinated Esmarch bandage and pneumatic calf tourniquet inflated to 250 mmHg.  Attention was first directed on the first metatarsal.  A linear incision was made medial to the extensor tendon along the distal one half of the first metatarsal to the MPJ.  The incision was made with a 15 blade scalpel the epidermis and the dermis.  The subcutaneous tissues were bluntly sharply dissected making sure retract all vital neurovascular structures and all bleeders were cauterized as necessary.  Deep incision was then made freeing soft tissue structures from the first metatarsal distally.  At this time was easily able to identify the previous plate and screw construct.  I remove the plates and screws in total without any complications.  There is no purulence or any signs of infection.  The bone appeared to be viable.  At this time I was easily able to also identify the osteotomy site and there is no anemia noted.  There is a 15 blade scalpel to cut through this.  Once I have mobilized the osteotomy I initially used a curette as well as a rongeur to remove any nonviable tissue present within the fracture site.  I then utilized a sagittal saw in order to resect the fracture on both sides.  I debrided the bone  down to healthy, bleeding edges.  Copiously irrigated with saline.  Also utilized a 2.0 mm drill bit for subchondral drilling at this time.  At this time the fracture was placed into anatomical position but still trying to correct the bunion.  Once in appropriate position was temporarily fixated.  I then chose an OsteoMed plate and screw construct.  There is temporary fixation confirmed under fluoroscopy.   Once the appropriate position 2 distal locking screws were then inserted followed by 2 nonlocking eccentrically drilled screws proximally.  Temporary fixation was removed and I then inserted the 2 nonlocking screws completely adequate compression at this time to be adequate compression across the fracture site.  Proximal locking screw was then inserted.  This time for additional stability at 3.0 millimeter screw was inserted across the fracture site to adequate compression and tightness as well.  This time the fracture site appear to be stable.  I irrigated the wound with saline.  I then utilized bone matrix completely to fill any voids that were present.  I copiously irrigated the wound with saline and hemostasis achieved.  Incision was closed in layered fashion the deep and subcutaneous tissue with Monocryl and skin was then closed with nylon.  Attention was then directed along the fourth metatarsal.  A linear incision was made on the fourth metatarsal distally.  Incision was made with a 15 with scalpel the epidermis and the dermis.  The subcutaneous tissue completely dissected.  Deep incision was made adjacent to the extensor tendon of the fourth metatarsal.  Fluoroscopy was utilized to confirm the appropriate metatarsal.  I utilized a sagittal saw to make an osteotomy.  All of this to float to allow to heal and position to help take pressure off of the plantar lesion.  I copiously irrigated with saline hemostasis achieved.  Incision was closed the deep and subcutaneous tissue with Monocryl and the skin was then closed with nylon.  Xeroform was applied followed by dry sterile dressing.  The tourniquet was released and found to be in immediate cap refill time to all the digits.  She was awoken anesthesia and found her father the procedure well any complications.  Transferred to PACU vital signs stable and vascular status intact.

## 2022-01-09 ENCOUNTER — Other Ambulatory Visit: Payer: Self-pay | Admitting: Podiatry

## 2022-01-10 ENCOUNTER — Other Ambulatory Visit: Payer: Self-pay | Admitting: Podiatry

## 2022-01-10 MED ORDER — OXYCODONE-ACETAMINOPHEN 5-325 MG PO TABS: 1.0000 | ORAL_TABLET | Freq: Four times a day (QID) | ORAL | 0 refills | Status: DC | PRN

## 2022-01-10 MED ORDER — CYCLOBENZAPRINE HCL 10 MG PO TABS: 10.0000 mg | ORAL_TABLET | Freq: Three times a day (TID) | ORAL | 0 refills | Status: DC | PRN

## 2022-01-13 ENCOUNTER — Other Ambulatory Visit: Payer: Self-pay | Admitting: Podiatry

## 2022-01-14 ENCOUNTER — Other Ambulatory Visit: Payer: Self-pay | Admitting: Podiatry

## 2022-01-14 ENCOUNTER — Encounter: Payer: Self-pay | Admitting: Podiatry

## 2022-01-14 MED ORDER — OXYCODONE-ACETAMINOPHEN 5-325 MG PO TABS: 1.0000 | ORAL_TABLET | Freq: Four times a day (QID) | ORAL | 0 refills | Status: DC | PRN

## 2022-01-17 ENCOUNTER — Ambulatory Visit (INDEPENDENT_AMBULATORY_CARE_PROVIDER_SITE_OTHER): Payer: Medicaid Other | Admitting: Podiatry

## 2022-01-17 DIAGNOSIS — M21611 Bunion of right foot: Secondary | ICD-10-CM

## 2022-01-17 DIAGNOSIS — M216X1 Other acquired deformities of right foot: Secondary | ICD-10-CM

## 2022-01-17 MED ORDER — OXYCODONE-ACETAMINOPHEN 5-325 MG PO TABS: 1.0000 | ORAL_TABLET | Freq: Four times a day (QID) | ORAL | 0 refills | Status: DC | PRN

## 2022-01-17 MED ORDER — GABAPENTIN 100 MG PO CAPS: 100.0000 mg | ORAL_CAPSULE | Freq: Three times a day (TID) | ORAL | 0 refills | Status: DC

## 2022-01-19 NOTE — Progress Notes (Signed)
Subjective: Chief Complaint  Patient presents with   Routine Post Op    POV#2 right foot bunion repair, patient denies any N/V/F/C/SOB, patient is having pain, Rate of pain 6 out of 10, Suture removal, TX: came boot      Monique [name] is a 31 y.o. 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  She presents today for suture removal.  Asking for refill of pain medication as well as gabapentin.  Pain is improving.  No fevers or chills.  No recent injury.  Has been nonweightbearing.  No other concerns.   Objective: General: No acute distress, AAOx3  DP/PT pulses palpable 2/4, CRT < 3 sec to all digits.  Protective sensation intact. Motor function intact.  Right foot: Incision is well coapted without any evidence of dehiscence with sutures intact.  There is decreased edema.  No erythema or warmth.  There is no fluctuation or crepitation there is no malodor.  Toes in rectus position.  Still tenderness to palpation at surgical sites.    No other open lesions or pre-ulcerative lesions.  No pain with calf compression, swelling, warmth, erythema.   Assessment and Plan:  Status post right foot surgery, doing well with no complications   -Treatment options discussed including all alternatives, risks, and complications -Sutures removed incisions healing well.  Steri-Strips applied for reinforcement followed by antibiotic drainage.  Discussed that she can wash with soap and water, dry thoroughly -Remain nonweightbearing in cam boot -She can start the bone stimulator. -Ice/elevation -Pain medication as needed-refill today -Monitor for any clinical signs or symptoms of infection and DVT/PE and directed to call the office immediately should any occur or go to the ER. -Follow-up as scheduled for suture removal or sooner if any problems arise. In the meantime, encouraged to call the office with any questions,  concerns, change in symptoms.   *X-ray next appt  Celesta Gentile, DPM

## 2022-01-20 ENCOUNTER — Other Ambulatory Visit: Payer: Self-pay | Admitting: Podiatry

## 2022-01-21 ENCOUNTER — Encounter: Payer: Self-pay | Admitting: Podiatry

## 2022-01-21 ENCOUNTER — Other Ambulatory Visit: Payer: Self-pay | Admitting: Podiatry

## 2022-01-21 MED ORDER — OXYCODONE-ACETAMINOPHEN 5-325 MG PO TABS: 1.0000 | ORAL_TABLET | Freq: Four times a day (QID) | ORAL | 0 refills | Status: DC | PRN

## 2022-01-23 ENCOUNTER — Other Ambulatory Visit: Payer: Self-pay | Admitting: Podiatry

## 2022-01-24 MED ORDER — OXYCODONE-ACETAMINOPHEN 5-325 MG PO TABS: 1.0000 | ORAL_TABLET | Freq: Four times a day (QID) | ORAL | 0 refills | Status: DC | PRN

## 2022-01-27 ENCOUNTER — Other Ambulatory Visit: Payer: Self-pay | Admitting: Podiatry

## 2022-01-28 ENCOUNTER — Other Ambulatory Visit: Payer: Self-pay | Admitting: Podiatry

## 2022-01-28 ENCOUNTER — Encounter: Payer: Self-pay | Admitting: Podiatry

## 2022-01-28 MED ORDER — GABAPENTIN 100 MG PO CAPS: 100.0000 mg | ORAL_CAPSULE | Freq: Three times a day (TID) | ORAL | 0 refills | Status: DC

## 2022-01-28 MED ORDER — CYCLOBENZAPRINE HCL 10 MG PO TABS: 10.0000 mg | ORAL_TABLET | Freq: Three times a day (TID) | ORAL | 0 refills | Status: AC | PRN

## 2022-01-28 MED ORDER — OXYCODONE-ACETAMINOPHEN 5-325 MG PO TABS: 1.0000 | ORAL_TABLET | Freq: Four times a day (QID) | ORAL | 0 refills | Status: DC | PRN

## 2022-01-30 ENCOUNTER — Other Ambulatory Visit: Payer: Self-pay | Admitting: Podiatry

## 2022-01-30 ENCOUNTER — Encounter: Payer: Self-pay | Admitting: Podiatry

## 2022-01-31 ENCOUNTER — Other Ambulatory Visit: Payer: Self-pay | Admitting: Podiatry

## 2022-01-31 ENCOUNTER — Encounter: Payer: Medicaid Other | Admitting: Podiatry

## 2022-01-31 ENCOUNTER — Telehealth: Payer: Self-pay | Admitting: Podiatry

## 2022-01-31 MED ORDER — OXYCODONE-ACETAMINOPHEN 5-325 MG PO TABS: 1.0000 | ORAL_TABLET | Freq: Four times a day (QID) | ORAL | 0 refills | Status: DC | PRN

## 2022-01-31 MED ORDER — CEPHALEXIN 500 MG PO CAPS: 500.0000 mg | ORAL_CAPSULE | Freq: Three times a day (TID) | ORAL | 0 refills | Status: AC

## 2022-01-31 NOTE — Telephone Encounter (Signed)
Message was sent to Dr. Jacqualyn Posey this morning from [place]. He has not looked at the message yet.

## 2022-01-31 NOTE — Telephone Encounter (Signed)
Pt called and has been sick throwing up all night and has a fever and was to have an appt today. I have rescheduled her to Monday. Pt then stated that it has been draining as well and she did tell Dr Jacqualyn Posey via my chart yesterday.

## 2022-02-02 ENCOUNTER — Other Ambulatory Visit: Payer: Self-pay | Admitting: Podiatry

## 2022-02-03 ENCOUNTER — Other Ambulatory Visit: Payer: Self-pay | Admitting: Podiatry

## 2022-02-04 ENCOUNTER — Ambulatory Visit (INDEPENDENT_AMBULATORY_CARE_PROVIDER_SITE_OTHER): Payer: Medicaid Other

## 2022-02-04 ENCOUNTER — Other Ambulatory Visit: Payer: Self-pay | Admitting: Podiatry

## 2022-02-04 ENCOUNTER — Encounter: Payer: Self-pay | Admitting: Podiatry

## 2022-02-04 ENCOUNTER — Ambulatory Visit (INDEPENDENT_AMBULATORY_CARE_PROVIDER_SITE_OTHER): Payer: Medicaid Other | Admitting: Podiatry

## 2022-02-04 DIAGNOSIS — Z9889 Other specified postprocedural states: Secondary | ICD-10-CM | POA: Diagnosis not present

## 2022-02-04 DIAGNOSIS — S92314D Nondisplaced fracture of first metatarsal bone, right foot, subsequent encounter for fracture with routine healing: Secondary | ICD-10-CM

## 2022-02-04 MED ORDER — OXYCODONE-ACETAMINOPHEN 5-325 MG PO TABS: 1.0000 | ORAL_TABLET | Freq: Four times a day (QID) | ORAL | 0 refills | Status: DC | PRN

## 2022-02-04 MED ORDER — CELECOXIB 100 MG PO CAPS: 100.0000 mg | ORAL_CAPSULE | Freq: Two times a day (BID) | ORAL | 0 refills | Status: DC

## 2022-02-05 ENCOUNTER — Other Ambulatory Visit: Payer: Self-pay | Admitting: Podiatry

## 2022-02-06 ENCOUNTER — Other Ambulatory Visit: Payer: Self-pay | Admitting: Podiatry

## 2022-02-07 ENCOUNTER — Other Ambulatory Visit: Payer: Self-pay | Admitting: Podiatry

## 2022-02-08 ENCOUNTER — Other Ambulatory Visit: Payer: Self-pay | Admitting: Podiatry

## 2022-02-08 ENCOUNTER — Encounter: Payer: Self-pay | Admitting: Podiatry

## 2022-02-08 MED ORDER — OXYCODONE-ACETAMINOPHEN 5-325 MG PO TABS: 1.0000 | ORAL_TABLET | Freq: Four times a day (QID) | ORAL | 0 refills | Status: DC | PRN

## 2022-02-11 ENCOUNTER — Other Ambulatory Visit: Payer: Self-pay | Admitting: Podiatry

## 2022-02-12 MED ORDER — OXYCODONE-ACETAMINOPHEN 5-325 MG PO TABS: 1.0000 | ORAL_TABLET | Freq: Four times a day (QID) | ORAL | 0 refills | Status: DC | PRN

## 2022-02-12 NOTE — Progress Notes (Signed)
Subjective: Chief Complaint  Patient presents with   Routine Post Op    POV # 3 right foot bunion repair, Patient has some pain, rate of pain 6 out of 10, has some drainage, X-rays taken, tx; Keflex Cam boot,     Monique [name] is a 31 y.o. 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  States that she is doing okay.  She had noted some drainage on the incision but she has been keeping a bandage on this is decreased.  She is on the Keflex.  States that she still having discomfort as well needing pain medication.  Denies any fevers or chills.  No chest pain or shortness of breath.   Objective: General: No acute distress, AAOx3  DP/PT pulses palpable 2/4, CRT < 3 sec to all digits.  Protective sensation intact. Motor function intact.  Right foot: Incision is well coapted without any evidence of dehiscence with sutures intact.  There is some scabbing on the central aspect and not able to appreciate any drainage or pus.  There is no erythema or warmth.  There is no fluctuance or crepitation.  No malodor. No pain with calf compression, swelling, warmth, erythema.        Assessment and Plan:  Status post right foot surgery, doing well with no complications   -Treatment options discussed including all alternatives, risks, and complications -X-rays were obtained reviewed.  3 views of the right foot were obtained.  Hardware intact with any complicating factors.  Status post osteotomy of the fourth metatarsal. -Incision without any drainage or signs of infection today.  Still keep small mount of antibiotic ointment and a dressing on this. -Continue cam boot, nonweightbearing. -Ice, elevation -Continue plan stimulator -Pain medication as needed-we discussed trying to use the gabapentin, anti-inflammatories and try to decrease the amount of narcotic usage. -Monitor for any clinical signs or symptoms  of infection and DVT/PE and directed to call the office immediately should any occur or go to the ER. -Follow-up as scheduled for repeat x-ray or sooner if any problems arise. In the meantime, encouraged to call the office with any questions, concerns, change in symptoms.   Trula Slade DPM

## 2022-02-15 ENCOUNTER — Other Ambulatory Visit: Payer: Self-pay | Admitting: Podiatry

## 2022-02-16 ENCOUNTER — Other Ambulatory Visit: Payer: Self-pay | Admitting: Podiatry

## 2022-02-17 ENCOUNTER — Other Ambulatory Visit: Payer: Self-pay | Admitting: Podiatry

## 2022-02-17 ENCOUNTER — Encounter: Payer: Self-pay | Admitting: Podiatry

## 2022-02-17 MED ORDER — HYDROCODONE-ACETAMINOPHEN 5-325 MG PO TABS: 1.0000 | ORAL_TABLET | Freq: Four times a day (QID) | ORAL | 0 refills | Status: DC | PRN

## 2022-02-18 ENCOUNTER — Ambulatory Visit (INDEPENDENT_AMBULATORY_CARE_PROVIDER_SITE_OTHER): Payer: Medicaid Other

## 2022-02-18 ENCOUNTER — Ambulatory Visit (INDEPENDENT_AMBULATORY_CARE_PROVIDER_SITE_OTHER): Payer: Medicaid Other | Admitting: Podiatry

## 2022-02-18 DIAGNOSIS — S92314D Nondisplaced fracture of first metatarsal bone, right foot, subsequent encounter for fracture with routine healing: Secondary | ICD-10-CM | POA: Diagnosis not present

## 2022-02-20 ENCOUNTER — Other Ambulatory Visit: Payer: Self-pay | Admitting: Podiatry

## 2022-02-20 ENCOUNTER — Encounter: Payer: Self-pay | Admitting: Podiatry

## 2022-02-22 ENCOUNTER — Other Ambulatory Visit: Payer: Self-pay | Admitting: Podiatry

## 2022-02-23 ENCOUNTER — Other Ambulatory Visit: Payer: Self-pay | Admitting: Podiatry

## 2022-02-23 ENCOUNTER — Encounter: Payer: Self-pay | Admitting: Podiatry

## 2022-02-24 ENCOUNTER — Other Ambulatory Visit: Payer: Self-pay | Admitting: Podiatry

## 2022-02-24 MED ORDER — HYDROCODONE-ACETAMINOPHEN 5-325 MG PO TABS: 1.0000 | ORAL_TABLET | Freq: Four times a day (QID) | ORAL | 0 refills | Status: DC | PRN

## 2022-02-24 NOTE — Progress Notes (Signed)
Subjective: Chief Complaint  Patient presents with   Routine Post Op    POV #4 DOS 01/02/2022 RT ORIF NON UNION 1ST METATARSAL FX, CUTTING OF 4TH METATARSAL    Monique [name] is a 31 y.o. 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  She feels that currently her pain is controlled.  No recent injury or changes.  She denies any fevers or chills.  No recent injuries or falls.  She still been using the bone stimulator.  Objective: General: No acute distress, AAOx3  DP/PT pulses palpable 2/4, CRT < 3 sec to all digits.  Protective sensation intact. Motor function intact.  Right foot: Incision is well coapted without any evidence of dehiscence.  There is still 1 suture intact which were removed.  Incisions healing well.  The swelling actually seems to be much improved as well even compared to what it was prior to surgery.  There is no erythema or warmth there is no clinical signs of infection.  No pain with calf compression, swelling, warmth, erythema.    Assessment and Plan:  Status post right foot surgery, doing well with no complications   -Treatment options discussed including all alternatives, risks, and complications -X-rays were obtained reviewed.  3 views of the right foot were obtained.  Hardware intact with any complicating factors.  Status post osteotomy of the fourth metatarsal. -Overall she seems to be improving.  Continue ice, elevate as well as compression.  Discussed that she can start to transition gradually to partial weightbearing in the cam boot. -She is going to continue with medication as needed but we discussed trying to keep on anti-inflammatories, gabapentin to hopefully continue to decrease the narcotic use.  *Repeat x-ray next appointment  Trula Slade DPM

## 2022-02-25 ENCOUNTER — Other Ambulatory Visit: Payer: Self-pay | Admitting: Podiatry

## 2022-02-25 DIAGNOSIS — S92314D Nondisplaced fracture of first metatarsal bone, right foot, subsequent encounter for fracture with routine healing: Secondary | ICD-10-CM

## 2022-02-25 DIAGNOSIS — Z9889 Other specified postprocedural states: Secondary | ICD-10-CM

## 2022-02-27 ENCOUNTER — Other Ambulatory Visit: Payer: Self-pay | Admitting: Podiatry

## 2022-02-27 MED ORDER — HYDROCODONE-ACETAMINOPHEN 5-325 MG PO TABS: 1.0000 | ORAL_TABLET | Freq: Four times a day (QID) | ORAL | 0 refills | Status: DC | PRN

## 2022-03-03 ENCOUNTER — Encounter: Payer: Self-pay | Admitting: Podiatry

## 2022-03-03 ENCOUNTER — Other Ambulatory Visit: Payer: Self-pay | Admitting: Podiatry

## 2022-03-04 ENCOUNTER — Other Ambulatory Visit: Payer: Self-pay | Admitting: Podiatry

## 2022-03-04 MED ORDER — HYDROCODONE-ACETAMINOPHEN 5-325 MG PO TABS: 1.0000 | ORAL_TABLET | Freq: Four times a day (QID) | ORAL | 0 refills | Status: DC | PRN

## 2022-03-08 ENCOUNTER — Other Ambulatory Visit: Payer: Self-pay | Admitting: Podiatry

## 2022-03-08 ENCOUNTER — Encounter: Payer: Self-pay | Admitting: Podiatry

## 2022-03-08 MED ORDER — GABAPENTIN 100 MG PO CAPS: 100.0000 mg | ORAL_CAPSULE | Freq: Three times a day (TID) | ORAL | 0 refills | Status: DC

## 2022-03-08 MED ORDER — HYDROCODONE-ACETAMINOPHEN 5-325 MG PO TABS: 1.0000 | ORAL_TABLET | Freq: Four times a day (QID) | ORAL | 0 refills | Status: DC | PRN

## 2022-03-08 MED ORDER — CELECOXIB 100 MG PO CAPS: 100.0000 mg | ORAL_CAPSULE | Freq: Two times a day (BID) | ORAL | 0 refills | Status: DC

## 2022-03-11 ENCOUNTER — Ambulatory Visit (INDEPENDENT_AMBULATORY_CARE_PROVIDER_SITE_OTHER): Payer: Medicaid Other | Admitting: Podiatry

## 2022-03-11 DIAGNOSIS — Z9889 Other specified postprocedural states: Secondary | ICD-10-CM

## 2022-03-11 DIAGNOSIS — M216X1 Other acquired deformities of right foot: Secondary | ICD-10-CM

## 2022-03-11 DIAGNOSIS — S92354G Nondisplaced fracture of fifth metatarsal bone, right foot, subsequent encounter for fracture with delayed healing: Secondary | ICD-10-CM

## 2022-03-12 ENCOUNTER — Other Ambulatory Visit: Payer: Self-pay | Admitting: Podiatry

## 2022-03-13 MED ORDER — HYDROCODONE-ACETAMINOPHEN 5-325 MG PO TABS: 1.0000 | ORAL_TABLET | Freq: Four times a day (QID) | ORAL | 0 refills | Status: DC | PRN

## 2022-03-17 ENCOUNTER — Other Ambulatory Visit: Payer: Self-pay | Admitting: Podiatry

## 2022-03-17 NOTE — Progress Notes (Signed)
Subjective: Chief Complaint  Patient presents with   Routine Post Op    Right foot bunion repair POV # 5, patient is having shoot pain, Rate of pain 4 out of 10, NO N/V/F/C/SOB, TX: surgical shoe, X-Ray done today     Monique [name] is a 31 y.o. 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  The callus on the ball of the foot partially came off with a bandage and she has been having pain to this area.  Otherwise she is been doing well.  Denies any fevers or chills.  She is still using the bone stimulator.  No recent injuries.    Objective: General: No acute distress, AAOx3  DP/PT pulses palpable 2/4, CRT < 3 sec to all digits.  Protective sensation intact. Motor function intact.  Right foot: Incision is well coapted without any evidence of dehiscence.  There is still 1 suture intact which were removed.  Incisions healing well.  Minimal callus submetatarsal right foot.  There is no underlying ulceration drainage or signs of infection. No pain with calf compression, swelling, warmth, erythema.    Assessment and Plan:  Status post right foot surgery, doing well with no complications   -Treatment options discussed including all alternatives, risks, and complications -X-rays were obtained reviewed.  3 views of the right foot were obtained.  Hardware intact with any complicating factors.  Radiolucency still noted but there does appear to be some consolidation present.  Status post osteotomy of the fourth metatarsal. -Main concern is the callus which was debrided today with any complications or bleeding.  Moisturizer, offloading.  -Continue above sooner.  Continue ice, elevate as well as compression.  Weight-bear as tolerated in cam boot. -We discussed try to wean off narcotics.  Trula Slade DPM

## 2022-03-18 MED ORDER — HYDROCODONE-ACETAMINOPHEN 5-325 MG PO TABS: 1.0000 | ORAL_TABLET | Freq: Four times a day (QID) | ORAL | 0 refills | Status: DC | PRN

## 2022-03-21 ENCOUNTER — Encounter: Payer: Self-pay | Admitting: Podiatry

## 2022-03-21 ENCOUNTER — Other Ambulatory Visit: Payer: Self-pay | Admitting: Podiatry

## 2022-03-21 MED ORDER — HYDROCODONE-ACETAMINOPHEN 5-325 MG PO TABS: 1.0000 | ORAL_TABLET | Freq: Four times a day (QID) | ORAL | 0 refills | Status: DC | PRN

## 2022-03-21 MED ORDER — CELECOXIB 100 MG PO CAPS: 100.0000 mg | ORAL_CAPSULE | Freq: Two times a day (BID) | ORAL | 0 refills | Status: DC

## 2022-03-26 ENCOUNTER — Other Ambulatory Visit: Payer: Self-pay | Admitting: Podiatry

## 2022-03-26 MED ORDER — HYDROCODONE-ACETAMINOPHEN 5-325 MG PO TABS: 1.0000 | ORAL_TABLET | Freq: Four times a day (QID) | ORAL | 0 refills | Status: DC | PRN

## 2022-03-30 ENCOUNTER — Other Ambulatory Visit: Payer: Self-pay | Admitting: Podiatry

## 2022-04-01 ENCOUNTER — Other Ambulatory Visit: Payer: Self-pay | Admitting: Podiatry

## 2022-04-02 ENCOUNTER — Other Ambulatory Visit: Payer: Self-pay | Admitting: Podiatry

## 2022-04-02 ENCOUNTER — Encounter: Payer: Self-pay | Admitting: Podiatry

## 2022-04-02 MED ORDER — HYDROCODONE-ACETAMINOPHEN 5-325 MG PO TABS: 1.0000 | ORAL_TABLET | Freq: Four times a day (QID) | ORAL | 0 refills | Status: DC | PRN

## 2022-04-05 ENCOUNTER — Encounter: Payer: Self-pay | Admitting: Podiatry

## 2022-04-05 ENCOUNTER — Other Ambulatory Visit: Payer: Self-pay | Admitting: Podiatry

## 2022-04-06 ENCOUNTER — Other Ambulatory Visit: Payer: Self-pay | Admitting: Podiatry

## 2022-04-07 ENCOUNTER — Other Ambulatory Visit: Payer: Self-pay | Admitting: Podiatry

## 2022-04-07 MED ORDER — TRAMADOL HCL 50 MG PO TABS: 50.0000 mg | ORAL_TABLET | Freq: Three times a day (TID) | ORAL | 0 refills | Status: DC | PRN

## 2022-04-09 ENCOUNTER — Ambulatory Visit (INDEPENDENT_AMBULATORY_CARE_PROVIDER_SITE_OTHER): Payer: Medicaid Other

## 2022-04-09 ENCOUNTER — Ambulatory Visit (INDEPENDENT_AMBULATORY_CARE_PROVIDER_SITE_OTHER): Payer: Medicaid Other | Admitting: Podiatry

## 2022-04-09 DIAGNOSIS — Z9889 Other specified postprocedural states: Secondary | ICD-10-CM

## 2022-04-09 DIAGNOSIS — M216X1 Other acquired deformities of right foot: Secondary | ICD-10-CM

## 2022-04-09 DIAGNOSIS — M21611 Bunion of right foot: Secondary | ICD-10-CM

## 2022-04-09 MED ORDER — ACETAMINOPHEN-CODEINE 300-30 MG PO TABS: 1.0000 | ORAL_TABLET | Freq: Four times a day (QID) | ORAL | 0 refills | Status: DC | PRN

## 2022-04-09 NOTE — Progress Notes (Signed)
Subjective: Chief Complaint  Patient presents with   Follow-up    POV #6 DOS 01/02/2022 RT ORIF NON UNION 1ST METATARSAL FX, CUTTING OF 4TH METATARSAl   32 year old female presents the office today with the above concerns.  She states that she is having soreness and she points on the fifth metatarsal base of the right foot which just started.  No injuries that she reports.  She still been wearing surgical shoe.  She still been using the bone stimulator daily.  No open lesions.  No fevers or chills.  Objective: AAO x3, NAD DP/PT pulses palpable bilaterally, CRT less than 3 seconds Significant tenderness palpation along the bunion site.  Mild discomfort on the fourth metatarsal osteotomy.  The callus that was present laterally has not come back.  Majority tenderness is localized to the fifth metatarsal base there is localized swelling.  It does seem to be improved compared to what it was first she states.  No tenderness on the tendon.  No other areas of discomfort noted at this time. No pain with calf compression, swelling, warmth, erythema  Assessment: 32 year old female status post bunionectomy repair for nonunion, osteotomy  Plan: -All treatment options discussed with the patient including all alternatives, risks, complications. -X-rays were obtained and reviewed.  3 views of the foot were obtained.  Increased consolidation along the first metatarsal.  Osteotomy still noted along the fourth metatarsal but there does appear to be some evidence of bone callus formation.  There is no evidence of acute fracture otherwise. -I like to continue to wean off the narcotics clinical down to Tylenol 3.  The tramadol was causing her to be sick.  Would like to focus on anti-inflammatories topically, orally as well as the gabapentin. -Discussed trying to get back into regular shoe.  Order try to do a graphite insert inside of her shoe. -Will also start physical therapy to work range of motion and help with  gait and transition to regular shoe. -Patient encouraged to call the office with any questions, concerns, change in symptoms.   Trula Slade DPM

## 2022-04-17 ENCOUNTER — Other Ambulatory Visit: Payer: Self-pay | Admitting: Podiatry

## 2022-04-17 ENCOUNTER — Encounter: Payer: Self-pay | Admitting: Podiatry

## 2022-04-17 MED ORDER — ACETAMINOPHEN-CODEINE 300-30 MG PO TABS: 1.0000 | ORAL_TABLET | Freq: Four times a day (QID) | ORAL | 0 refills | Status: DC | PRN

## 2022-04-17 MED ORDER — GABAPENTIN 100 MG PO CAPS: 100.0000 mg | ORAL_CAPSULE | Freq: Three times a day (TID) | ORAL | 0 refills | Status: DC

## 2022-04-17 MED ORDER — CELECOXIB 100 MG PO CAPS: 100.0000 mg | ORAL_CAPSULE | Freq: Two times a day (BID) | ORAL | 0 refills | Status: AC

## 2022-05-07 ENCOUNTER — Encounter: Payer: Medicaid Other | Admitting: Podiatry

## 2022-10-17 ENCOUNTER — Ambulatory Visit (INDEPENDENT_AMBULATORY_CARE_PROVIDER_SITE_OTHER): Payer: Medicaid Other

## 2022-10-17 ENCOUNTER — Ambulatory Visit: Payer: Medicaid Other | Admitting: Podiatry

## 2022-10-17 DIAGNOSIS — D492 Neoplasm of unspecified behavior of bone, soft tissue, and skin: Secondary | ICD-10-CM | POA: Diagnosis not present

## 2022-10-17 DIAGNOSIS — M79671 Pain in right foot: Secondary | ICD-10-CM | POA: Diagnosis not present

## 2022-10-17 DIAGNOSIS — S92344K Nondisplaced fracture of fourth metatarsal bone, right foot, subsequent encounter for fracture with nonunion: Secondary | ICD-10-CM

## 2022-10-17 MED ORDER — MELOXICAM 15 MG PO TABS: 15.0000 mg | ORAL_TABLET | Freq: Every day | ORAL | 0 refills | Status: DC | PRN

## 2022-10-17 NOTE — Patient Instructions (Signed)
Keep the bandage on for 24 hours. At that time, remove and clean with soap and water. If it hurts or burns before 24 hours go ahead and remove the bandage and wash with soap and water. Keep the area clean. If there is any blistering cover with antibiotic ointment and a bandage. Monitor for any redness, drainage, or other signs of infection. Call the office if any are to occur. If you have any questions, please call the office at [phone].  

## 2022-10-20 NOTE — Progress Notes (Signed)
Subjective: Chief Complaint  Patient presents with   Foot Pain    POV #7DOS 01/02/2022 RT ORIF NON UNION 1ST METATARSAL FX, CUTTING OF 4TH METATARSAL, patient is having some swelling and pain, rate of pain 4 out of 10, No N/V/F/C/SOB, X-Rays done today     32 year old female presents the office today with the above concerns.  Since she still having pain and swelling she also has 1 skin lesion on the area of the incision along the bunion is causing pain.  No recent injury or changes otherwise.  She is no longer using the bone stimulator.   Objective: AAO x3, NAD DP/PT pulses palpable bilaterally, CRT less than 3 seconds Significant tenderness palpation along the bunion site.  There is 1 small hyperkeratotic lesion area on the incision which is causing discomfort.  No edema, erythema or signs of infection.  Mild edema.  There is no erythema or warmth. No pain with calf compression, swelling, warmth, erythema    Assessment: 32 year old female status post bunionectomy repair for nonunion, osteotomy  Plan: -All treatment options discussed with the patient including all alternatives, risks, complications. -X-rays were obtained and reviewed.  3 views of the foot were obtained.  Increased consolidation along the first metatarsal.  Osteotomy still noted along the fourth metatarsal with evidence of nonunion at this time. -Recommend return to the bone similar -Sharply debrided the hyperkeratotic lesion without any complications.  Clean skin with alcohol.  Pad was placed, salicylic acid and a bandage.  Postprocedure instructions discussed.  Monitoring signs or symptoms of infection. -Stiffer sole shoes.  Return for foot pain right 6-8 weeks.  Vivi Barrack DPM

## 2022-11-28 ENCOUNTER — Ambulatory Visit (INDEPENDENT_AMBULATORY_CARE_PROVIDER_SITE_OTHER): Payer: Medicaid Other

## 2022-11-28 ENCOUNTER — Ambulatory Visit (INDEPENDENT_AMBULATORY_CARE_PROVIDER_SITE_OTHER): Payer: Medicaid Other | Admitting: Podiatry

## 2022-11-28 DIAGNOSIS — M21619 Bunion of unspecified foot: Secondary | ICD-10-CM

## 2022-11-28 DIAGNOSIS — S92344K Nondisplaced fracture of fourth metatarsal bone, right foot, subsequent encounter for fracture with nonunion: Secondary | ICD-10-CM | POA: Diagnosis not present

## 2022-11-28 MED ORDER — MELOXICAM 15 MG PO TABS: 15.0000 mg | ORAL_TABLET | Freq: Every day | ORAL | 0 refills | Status: AC | PRN

## 2022-11-28 NOTE — Patient Instructions (Signed)
Keep the bandage on for 24 hours. At that time, remove and clean with soap and water. If it hurts or burns before 24 hours go ahead and remove the bandage and wash with soap and water. Keep the area clean. If there is any blistering cover with antibiotic ointment and a bandage. Monitor for any redness, drainage, or other signs of infection. Call the office if any are to occur. If you have any questions, please call the office at [phone].  

## 2022-12-04 NOTE — Progress Notes (Signed)
Subjective: No chief complaint on file.   32 year old female presents the office today with the above concerns.  Has been using the bone stimulator.  Most of her pain is still on the bunion area today.  There is a skin lesion still causing discomfort.  She denies any open sores or any drainage.   Has been getting pain along the left foot bunion.  Objective: AAO x3, NAD DP/PT pulses palpable bilaterally, CRT less than 3 seconds There is still tenderness palpation along the bunion site.  Not able to palpate the hardware.  There is 1 small hyperkeratotic lesion area on the incision which is causing discomfort.  No edema, erythema or signs of infection.  Mild edema.  There is no erythema or warmth. No significant pain along the area of the fourth metatarsal osteotomy. Moderate bunion present left foot.  Tenderness palpation directly over the bunion.  There is no pain or crepitation with MPJ range of motion there is no hypermobility the first ray. No pain with calf compression, swelling, warmth, erythema    Assessment: 32 year old female status post bunionectomy repair for nonunion, osteotomy; bunion left foot  Plan: -All treatment options discussed with the patient including all alternatives, risks, complications. -X-rays were obtained and reviewed.  3 views of the foot were obtained.  Increased consolidation along the first metatarsal but also along the fourth metatarsal. -Recommend continue use of bone stimulator -Stiffer soled shoe. -Sharply debrided the hyperkeratotic lesion without any complications.  Clean skin with alcohol.  Pad was placed, salicylic acid and a bandage.  Postprocedure instructions discussed.  Monitoring signs or symptoms of infection. -We discussed possible hardware removal Badik to hold off on this if possible for now. -Is interested in bunion surgery in the left foot.  We discussed also bunionectomy.  She is in continue issues, good support as well as offloading and  padding.  Return for foot pain right 6-8 weeks.  Vivi Barrack DPM

## 2023-01-24 ENCOUNTER — Encounter: Payer: Self-pay | Admitting: Podiatry

## 2023-01-28 ENCOUNTER — Ambulatory Visit (INDEPENDENT_AMBULATORY_CARE_PROVIDER_SITE_OTHER): Payer: 59

## 2023-01-28 ENCOUNTER — Ambulatory Visit: Payer: 59

## 2023-01-28 ENCOUNTER — Encounter: Payer: Self-pay | Admitting: Podiatry

## 2023-01-28 ENCOUNTER — Ambulatory Visit (INDEPENDENT_AMBULATORY_CARE_PROVIDER_SITE_OTHER): Payer: 59 | Admitting: Podiatry

## 2023-01-28 DIAGNOSIS — S93401A Sprain of unspecified ligament of right ankle, initial encounter: Secondary | ICD-10-CM | POA: Diagnosis not present

## 2023-01-28 DIAGNOSIS — M21619 Bunion of unspecified foot: Secondary | ICD-10-CM

## 2023-01-28 DIAGNOSIS — M21611 Bunion of right foot: Secondary | ICD-10-CM | POA: Diagnosis not present

## 2023-01-28 MED ORDER — HYDROCODONE-ACETAMINOPHEN 5-325 MG PO TABS: 1.0000 | ORAL_TABLET | Freq: Four times a day (QID) | ORAL | 0 refills | Status: DC | PRN

## 2023-01-28 NOTE — Patient Instructions (Signed)
Ankle Sprain, Phase I Rehab An ankle sprain is an injury to the tissues that connect bone to bone (ligaments) in your ankle. Ankle sprains can cause stiffness, loss of motion, and loss of strength. Ask your health care provider which exercises are safe for you. Do exercises exactly as told by your provider and adjust them as directed. It is normal to feel mild stretching, pulling, tightness, or discomfort as you do these exercises. Stop right away if you feel sudden pain or your pain gets worse. Do not begin these exercises until told by your provider. Stretching and range-of-motion exercises These exercises warm up your muscles and joints. They can improve the movement and flexibility of your lower leg and ankle. They also help to relieve pain and stiffness. Gastroc and soleus stretch This exercise is also called a calf stretch. It stretches the muscles in the back of the lower leg. These muscles are the gastrocnemius, or gastroc, and the soleus. Sit on the floor with your left / right leg extended. Loop a belt or towel around the ball of your left / right foot. The ball of your foot is on the walking surface, right under your toes. Keep your left / right ankle and foot relaxed and keep your knee straight. Use the belt or towel to pull your foot toward you. You should feel a gentle stretch behind your calf or knee in your gastroc muscle. Hold this position for __________ seconds, then release to the starting position. Repeat the exercise with your knee bent. You can put a pillow or a rolled bath towel under your knee to support it. You should feel a stretch deep in your calf in the soleus muscle or at your Achilles tendon. Repeat __________ times. Complete this exercise __________ times a day. Ankle alphabet  Sit with your left / right leg supported at the lower leg. Do not rest your foot on anything. Make sure your foot has room to move freely. Think of your left / right foot as a  paintbrush. Move your foot to trace each letter of the alphabet in the air. Keep your hip and knee still while you trace. Make the letters as large as you can without feeling discomfort. Trace every letter from A to Z. Repeat __________ times. Complete this exercise __________ times a day. Strengthening exercises These exercises build strength and endurance in your ankle and lower leg. Endurance is the ability to use your muscles for a long time, even after they get tired. Ankle dorsiflexion  Secure a rubber exercise band or tube to an object, such as a table leg, that will stay still when the band is pulled. Secure the other end around your left / right foot. Sit on the floor facing the object, with your left / right leg extended. The band or tube should be slightly tense when your foot is relaxed. Slowly bring your foot toward you, bringing the top of your foot toward your shin (dorsiflexion), and pulling the band tighter. Hold this position for __________ seconds. Slowly return your foot to the starting position. Repeat __________ times. Complete this exercise __________ times a day. Ankle plantar flexion  Sit on the floor with your left / right leg extended. Loop a rubber exercise tube or band around the ball of your left / right foot. The ball of your foot is on the walking surface, right under your toes. Hold the ends of the band or tube in your hands. The band or tube should be   slightly tense when your foot is relaxed. Slowly point your foot and toes downward to tilt the top of your foot away from your shin (plantar flexion). Hold this position for __________ seconds. Slowly return your foot to the starting position. Repeat __________ times. Complete this exercise __________ times a day. Ankle eversion  Sit on the floor with your legs straight out in front of you. Loop a rubber exercise band or tube around the ball of your left / right foot. The ball of your foot is on the walking  surface, right under your toes. Hold the ends of the band in your hands or secure the band to a stable object. The band or tube should be slightly tense when your foot is relaxed. Slowly push your foot outward, away from your other leg (eversion). Hold this position for __________ seconds. Slowly return your foot to the starting position. Repeat __________ times. Complete this exercise __________ times a day. This information is not intended to replace advice given to you by your health care provider. Make sure you discuss any questions you have with your health care provider. Document Revised: 01/09/2022 Document Reviewed: 01/09/2022 Elsevier Patient Education  2024 [employer]  

## 2023-01-28 NOTE — Progress Notes (Signed)
Subjective: No chief complaint on file.  32 year old female presents the office today with concerns of right ankle pain.  She states that around October 19 she stepped on her son's car and she twisted her ankle and she fell.  Since that she is having pain to her ankle mostly lateral aspect and some swelling.  She tried alternating Tylenol ibuprofen improvement.  No other injuries.  Overall her foot pain has been getting better on her right foot.  She has some questions about her left foot bunion to help prevent it getting worse.   Objective: AAO x3, NAD DP/PT pulses palpable bilaterally, CRT less than 3 seconds Overall the tenderness and prior surgeries in the right foot is much improved.  States she has tenderness to the anterior aspect ankle as well as lateral aspect, but seems to be along the lateral ankle complex.  She has some tenderness in the distal portion of the tibia, fibula.  No proximal tib-fib pain.  No pain on the fifth metatarsal base or talus.  Edema to the lateral aspect of the ankle along the lateral ankle complex. Bunion present left foot. No pain with calf compression, swelling, warmth, erythema    Assessment: 32 year old female status post bunionectomy repair for nonunion, osteotomy; bunion left foot  Plan: -All treatment options discussed with the patient including all alternatives, risks, complications. -X-rays were obtained and reviewed.  Multiple views of the foot, ankle were obtained.  No evidence of acute fracture.  Hardware intact with increased consolidation across the fracture, osteotomy sites. -Recommend continue use of bone stimulator -Stiffer soled shoe. -The ankle dispensed a cam boot for immobilization as hers is worn out.  Discussed ice, anti-inflammatories as needed.  As she starts to improve discussed rehab exercises.  Monitor symptoms.  She can start to transition back to regular shoe as tolerated. -Dispensed toe started on the right foot to help hold  the hallux more rectus due to the bunion.  Monitor consider surgery discussed previously.  Return in about 6 weeks (around 03/11/2023) for ankle srain .  Vivi Barrack DPM

## 2023-02-03 ENCOUNTER — Other Ambulatory Visit: Payer: Self-pay | Admitting: Podiatry

## 2023-02-03 MED ORDER — HYDROCODONE-ACETAMINOPHEN 5-325 MG PO TABS: 1.0000 | ORAL_TABLET | Freq: Four times a day (QID) | ORAL | 0 refills | Status: DC | PRN

## 2023-03-11 ENCOUNTER — Ambulatory Visit: Payer: 59 | Admitting: Podiatry

## 2023-06-26 ENCOUNTER — Ambulatory Visit (INDEPENDENT_AMBULATORY_CARE_PROVIDER_SITE_OTHER)

## 2023-06-26 ENCOUNTER — Ambulatory Visit: Admitting: Podiatry

## 2023-06-26 DIAGNOSIS — M21619 Bunion of unspecified foot: Secondary | ICD-10-CM

## 2023-06-26 DIAGNOSIS — M21611 Bunion of right foot: Secondary | ICD-10-CM | POA: Diagnosis not present

## 2023-06-26 DIAGNOSIS — M79671 Pain in right foot: Secondary | ICD-10-CM

## 2023-06-26 DIAGNOSIS — M7751 Other enthesopathy of right foot: Secondary | ICD-10-CM

## 2023-06-26 DIAGNOSIS — M25571 Pain in right ankle and joints of right foot: Secondary | ICD-10-CM

## 2023-06-26 MED ORDER — ACETAMINOPHEN-CODEINE 300-30 MG PO TABS: 1.0000 | ORAL_TABLET | Freq: Four times a day (QID) | ORAL | 0 refills | Status: DC | PRN

## 2023-06-26 MED ORDER — METHYLPREDNISOLONE 4 MG PO TBPK: ORAL_TABLET | ORAL | 0 refills | Status: AC

## 2023-06-29 NOTE — Progress Notes (Signed)
 Subjective: Chief Complaint  Patient presents with   Ankle Pain    RM#11 Right ankle pain and swelling continues.    33 year old female presents the office today with concerns of right ankle pain, which continues.  This started after she had an injury on October 19 she was seen at ankle.  She did wear the boot for some time she is continue to have pain and swelling to the ankle.  She gets some swelling of the foot she thinks is coming from the ankle.  No recent injury or changes otherwise.  She is asking for prescription of Tylenol 3.  She alternates over-the-counter Tylenol, ibuprofen.   Objective: AAO x3, NAD DP/PT pulses palpable bilaterally, CRT less than 3 seconds Overall the tenderness and prior surgeries in the right foot is much improved, no significant pain today.  The tenderness today is still on the lateral aspect the ankle on the lateral ankle complex and along the course the peroneal tendon.  Unable to appreciate any area pinpoint tenderness.  There are some localized edema to the area there is no erythema or warmth.  Ankle, subtalar joint range of motion intact. Bunion present left foot. No pain with calf compression, swelling, warmth, erythema  Assessment: 33 year old female status post bunionectomy repair for nonunion, osteotomy; bunion left foot  Plan: -All treatment options discussed with the patient including all alternatives, risks, complications. -X-rays obtained reviewed.  Hardware intact from prior surgery as well as accompanying factors.  Increased consolidation noted.  There is no evidence of acute fracture. -At this time given her continuation of symptoms which been ongoing since October despite conservative treatment with immobilization, icing, anti-inflammatories she continues to have pain.  Due to this I have ordered an MRI.  Ankle brace for now immobilization with a boot.  However Tylenol 3 to take as needed, and discussed risks.  Vivi Barrack DPM

## 2023-07-04 ENCOUNTER — Other Ambulatory Visit: Payer: Self-pay | Admitting: Podiatry

## 2023-07-04 ENCOUNTER — Encounter: Payer: Self-pay | Admitting: Podiatry

## 2023-07-04 MED ORDER — HYDROCODONE-ACETAMINOPHEN 5-325 MG PO TABS: 1.0000 | ORAL_TABLET | Freq: Four times a day (QID) | ORAL | 0 refills | Status: AC | PRN

## 2023-07-06 ENCOUNTER — Ambulatory Visit
Admission: RE | Admit: 2023-07-06 | Discharge: 2023-07-06 | Disposition: A | Discharge status: Home / Self Care | Source: Ambulatory Visit | Attending: Podiatry | Admitting: Podiatry

## 2023-07-06 DIAGNOSIS — M7751 Other enthesopathy of right foot: Secondary | ICD-10-CM

## 2023-07-07 ENCOUNTER — Encounter: Payer: Self-pay | Admitting: Podiatry

## 2023-07-08 ENCOUNTER — Encounter: Payer: Self-pay | Admitting: Podiatry

## 2023-07-08 ENCOUNTER — Ambulatory Visit: Admitting: Podiatry

## 2023-07-08 DIAGNOSIS — M7751 Other enthesopathy of right foot: Secondary | ICD-10-CM | POA: Diagnosis not present

## 2023-07-08 MED ORDER — GABAPENTIN 100 MG PO CAPS: 100.0000 mg | ORAL_CAPSULE | Freq: Three times a day (TID) | ORAL | 0 refills | Status: AC

## 2023-07-10 NOTE — Progress Notes (Unsigned)
 Subjective: Chief Complaint  Patient presents with   Foot Pain    RM#12 Right foot pain starts at the knee and stops at the toes.Pain continues not sure what is going on.     33 year old female presents the office today with concerns of sharp pain from her knees down to her feet.  She says that this is present on both sides.  It does seem to start somewhat after certain positioning of sitting.  The pain medicine does help.  She still gets some pain in the right ankle and she had an MRI performed recently.  No recent injuries.   Objective: AAO x3, NAD DP/PT pulses palpable bilaterally, CRT less than 3 seconds Overall the tenderness and prior surgeries in the right foot is much improved, no significant pain today.  There is mild tenderness today is still on the lateral aspect the ankle on the lateral ankle complex and along the course the peroneal tendon.  Unable to appreciate any area pinpoint tenderness.  There are some localized edema to the area there is no erythema or warmth.  Ankle, subtalar joint range of motion intact. Bunion present left foot. She does note sharp pains from her knees down to her feet bilaterally.  Does not radiate up higher. No pain with calf compression, swelling, warmth, erythema  Assessment: 33 year old female status post bunionectomy repair for nonunion, osteotomy; bunion left foot  Plan: -All treatment options discussed with the patient including all alternatives, risks, complications. -Awaiting MRI results of the right ankle. -I do think that her symptoms in both legs are more nerve related.  Discussed possible back issues which she has had previously and recommend follow-up with her PCP for other evaluation of this.  She has the pain medicine but also gabapentin to help.  This could be transient will continue to monitor but if symptoms persist recommend nerve conduction test.  Vivi Barrack DPM

## 2023-07-11 ENCOUNTER — Ambulatory Visit: Admitting: Podiatry

## 2023-08-07 ENCOUNTER — Encounter: Payer: Self-pay | Admitting: Podiatry

## 2023-08-07 ENCOUNTER — Ambulatory Visit: Admitting: Podiatry

## 2023-12-03 IMAGING — MR MR FOOT*R* W/O CM
4 of 5 series · 18 of 40 positions shown · non-contrast
Comparison: Right foot radiograph 01/29/2021

CLINICAL DATA: Foot pain, chronic, osteoarthritis suspected

EXAM:
MRI OF THE RIGHT FOREFOOT WITHOUT CONTRAST
TECHNIQUE: Multiplanar, multisequence MR imaging of the right forefoot was
performed. No intravenous contrast was administered.

[Series 1: T1 · coronal · 3.0mm · 0.22mm/px · 3 of 48 slices shown (1 of 2)]
[im 5/48]
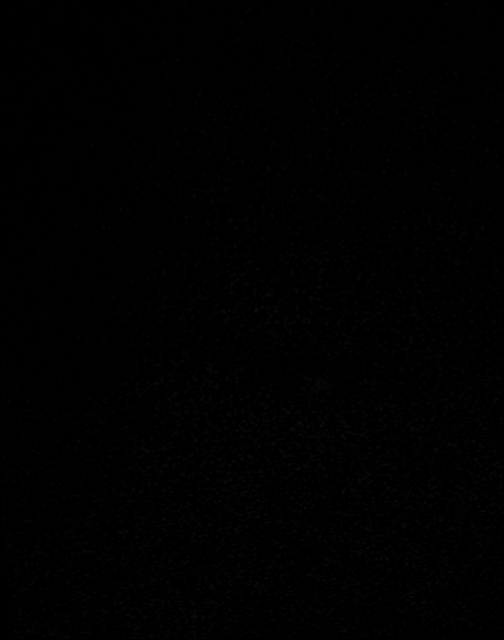
[im 24/48]
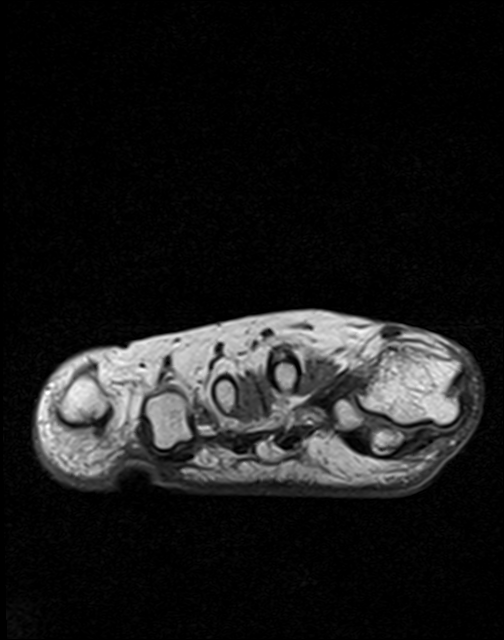
[im 43/48]
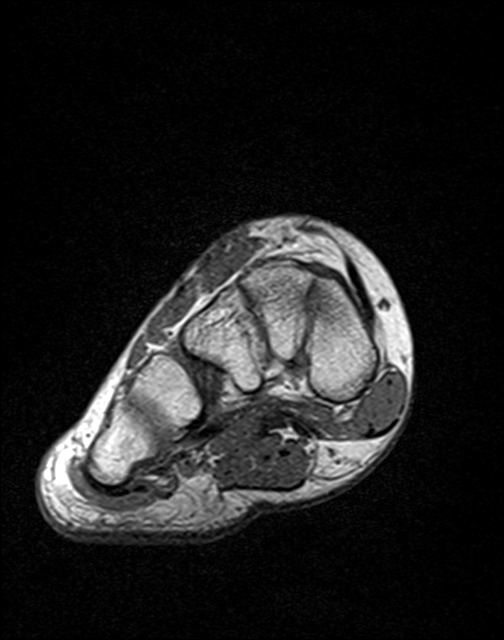

[Series 2: T2 fat-sat · coronal · 3.0mm · 0.22mm/px · 9 of 48 slices shown (1 of 2)]
[im 1/48]
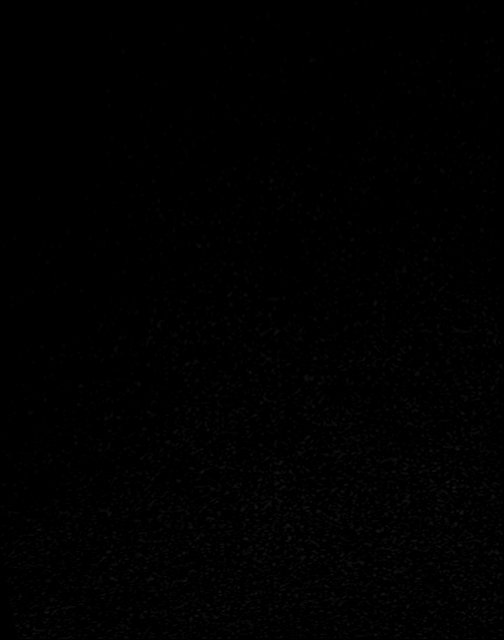
[im 5/48]
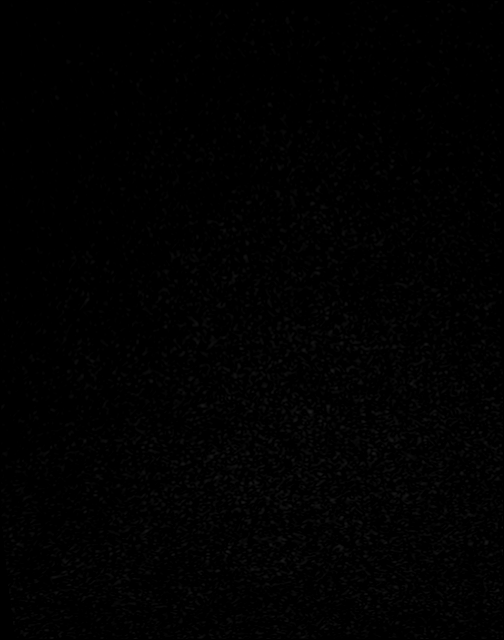
[im 10/48]
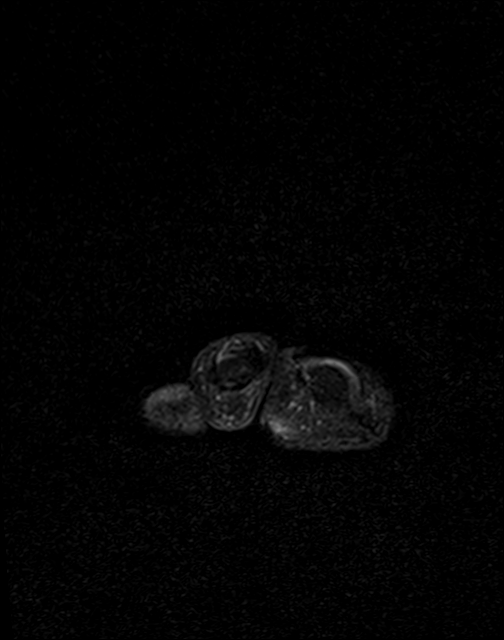
[im 15/48]
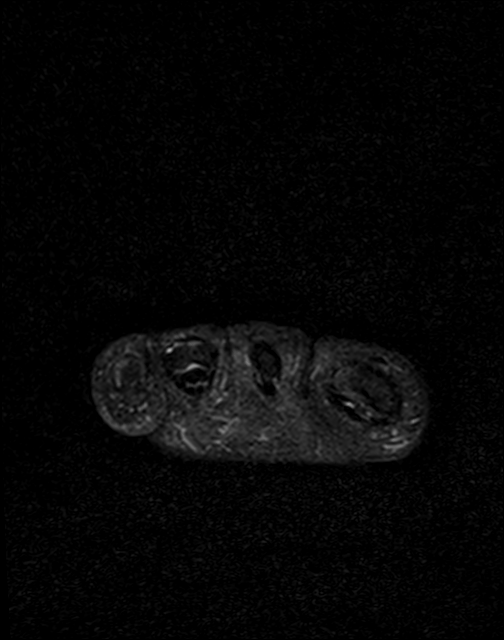
[im 19/48]
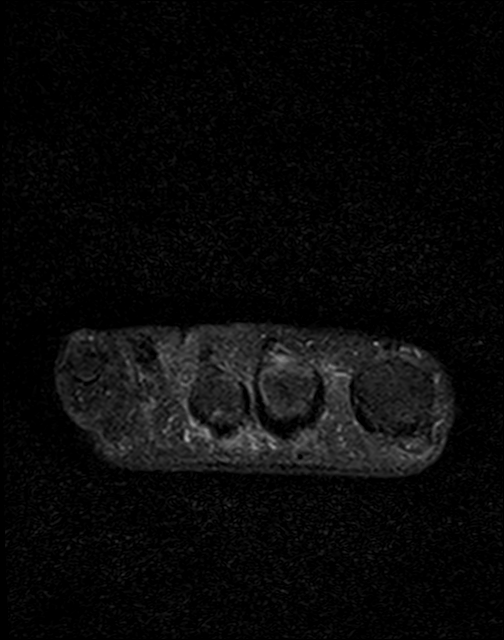
[im 24/48]
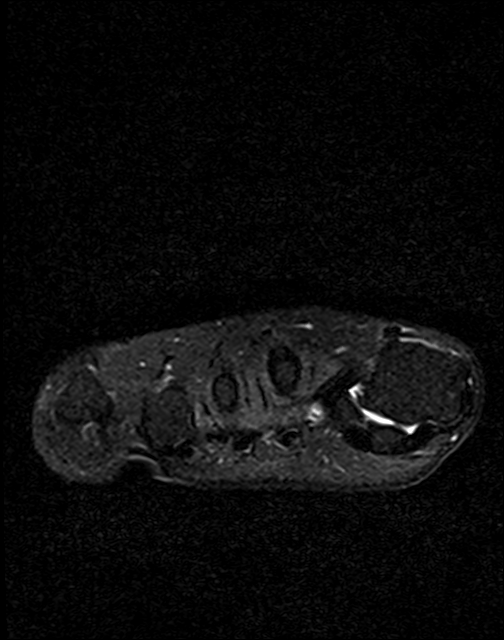
[im 29/48]
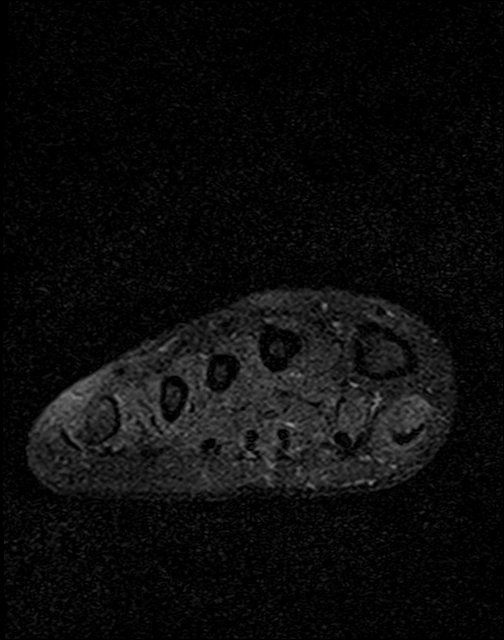
[im 33/48]
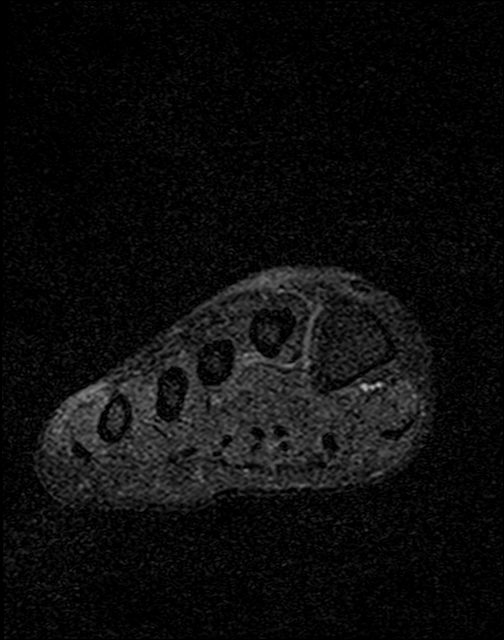
[im 43/48]
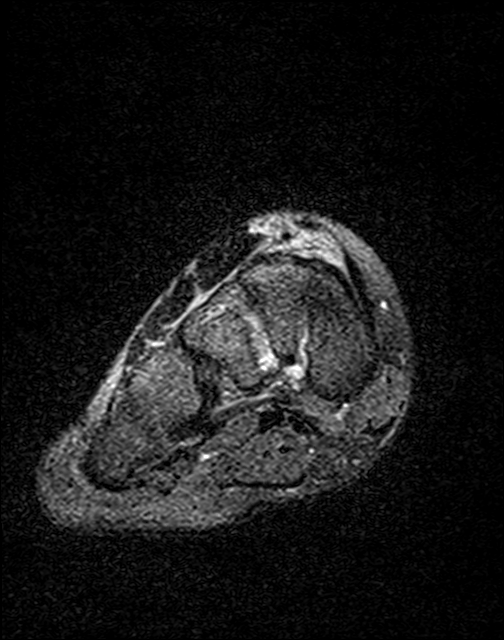

[Series 3: T2 fat-sat · axial · 3.0mm · 0.35mm/px · z∈[-134,-54]mm · 3 of 22 slices shown (2 of 2)]
[im 1/22]
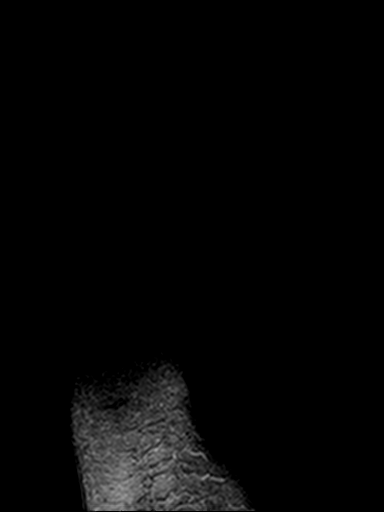
[im 11/22]
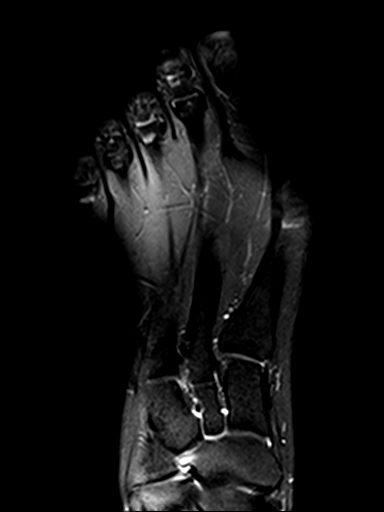
[im 22/22]
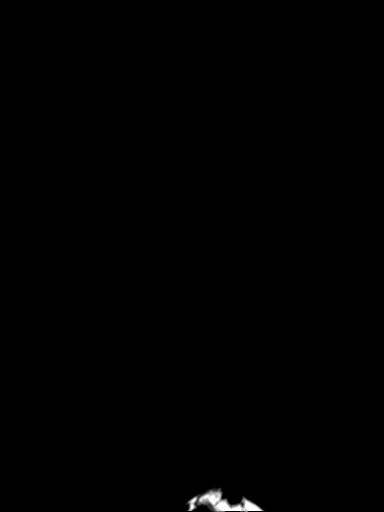

[Series 4: T1 · axial · 3.0mm · 0.35mm/px · z∈[-134,-54]mm · 3 of 22 slices shown (2 of 2)]
[im 1/22]
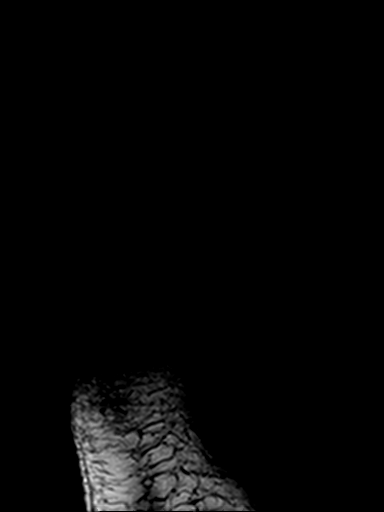
[im 11/22]
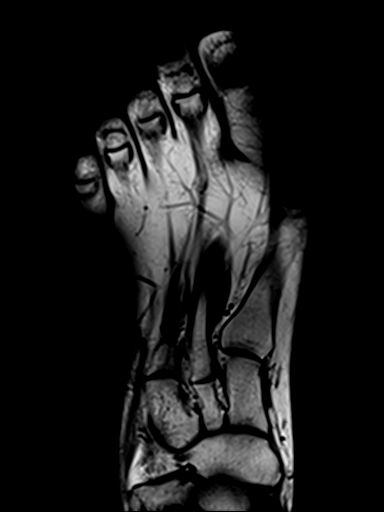
[im 22/22]
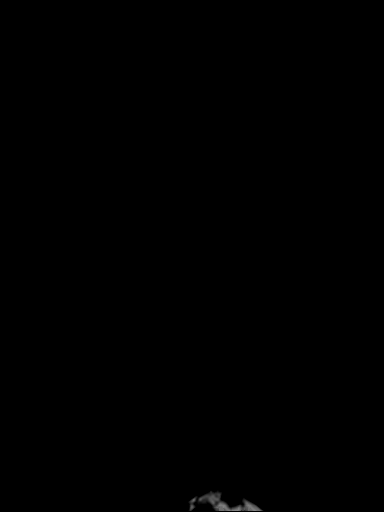

[18 of 40 positions shown; findings below may reference images not displayed]

FINDINGS: Bones/Joint/Cartilage

Hallux valgus deformity with mild first MTP osteoarthritis with
small effusion. Multiple lesser digit flexion deformities. No
significant marrow signal alteration. The cortex is intact.

Ligaments

Intact Lisfranc ligament. The medial collateral ligament of the
great toe MTP appears thin. There is a likely chronic low-grade
plantar plate tear at the great toe MTP joint (sagittal stir image
26). The great toe metatarso-sesamoid joints are unremarkable.

Muscles and Tendons

There is no significant muscle atrophy or edema. No acute tendon
tear in the forefoot.

Soft tissues

There is a tiny intermetatarsal neuroma between the second and third
metatarsal heads (series 1, image 30). This measures 6 x 5 mm.
Minimal adjacent intermetatarsal bursitis. Mild intermetatarsal
bursitis in the third webspace between the third and fourth
metatarsal heads.
IMPRESSION: Hallux valgus with mild first MTP osteoarthritis and small effusion.

Chronic medial collateral ligament sprain and low-grade plantar
plate tear at the great toe MTP joint.

Tiny intermetatarsal neuroma in the second webspace with minimal
adjacent intermetatarsal bursitis. Mild intermetatarsal bursitis in
the third webspace.
